# Patient Record
Sex: Male | Born: 1950 | Hispanic: No | Marital: Single
Health system: Southern US, Community
[De-identification: ages and names within clinical notes are randomized; demographics above are authoritative.]

---

## 2016-07-20 ENCOUNTER — Inpatient Hospital Stay
Admission: AD | Admit: 2016-07-20 | Discharge: 2016-08-10 | Disposition: A | Discharge status: Skilled Nursing Facility | Payer: Self-pay | Source: Ambulatory Visit | Attending: Physician Assistant | Admitting: Physician Assistant

## 2016-07-20 ENCOUNTER — Other Ambulatory Visit (HOSPITAL_COMMUNITY): Payer: Self-pay

## 2016-07-20 DIAGNOSIS — R4182 Altered mental status, unspecified: Secondary | ICD-10-CM

## 2016-07-20 DIAGNOSIS — Z931 Gastrostomy status: Secondary | ICD-10-CM

## 2016-07-20 DIAGNOSIS — R1310 Dysphagia, unspecified: Secondary | ICD-10-CM

## 2016-07-20 DIAGNOSIS — Z452 Encounter for adjustment and management of vascular access device: Secondary | ICD-10-CM

## 2016-07-20 DIAGNOSIS — R0989 Other specified symptoms and signs involving the circulatory and respiratory systems: Secondary | ICD-10-CM

## 2016-07-20 DIAGNOSIS — J189 Pneumonia, unspecified organism: Secondary | ICD-10-CM

## 2016-07-20 DIAGNOSIS — T17908A Unspecified foreign body in respiratory tract, part unspecified causing other injury, initial encounter: Secondary | ICD-10-CM

## 2016-07-20 DIAGNOSIS — J9690 Respiratory failure, unspecified, unspecified whether with hypoxia or hypercapnia: Secondary | ICD-10-CM

## 2016-07-20 DIAGNOSIS — D72829 Elevated white blood cell count, unspecified: Secondary | ICD-10-CM

## 2016-07-20 LAB — VANCOMYCIN, TROUGH: Vancomycin Tr: 29 ug/mL (ref 15–20)

## 2016-07-20 MED ORDER — DIATRIZOATE MEGLUMINE & SODIUM 66-10 % PO SOLN
ORAL | Status: AC
  Filled 2016-07-20: qty 30

## 2016-07-21 LAB — COMPREHENSIVE METABOLIC PANEL
ALT: 16 U/L — ABNORMAL LOW (ref 17–63)
AST: 25 U/L (ref 15–41)
Albumin: 2.3 g/dL — ABNORMAL LOW (ref 3.5–5.0)
Alkaline Phosphatase: 110 U/L (ref 38–126)
Anion gap: 12 (ref 5–15)
BUN: 8 mg/dL (ref 6–20)
CO2: 23 mmol/L (ref 22–32)
Calcium: 8.9 mg/dL (ref 8.9–10.3)
Chloride: 109 mmol/L (ref 101–111)
Creatinine, Ser: 1.05 mg/dL (ref 0.61–1.24)
GFR calc Af Amer: >60 mL/min (ref >60)
GFR calc non Af Amer: >60 mL/min (ref >60)
Glucose, Bld: 88 mg/dL (ref 65–99)
Potassium: 3.1 mmol/L — ABNORMAL LOW (ref 3.5–5.1)
Sodium: 144 mmol/L (ref 135–145)
Total Bilirubin: 0.8 mg/dL (ref 0.3–1.2)
Total Protein: 5.6 g/dL — ABNORMAL LOW (ref 6.5–8.1)

## 2016-07-21 LAB — VANCOMYCIN, TROUGH: Vancomycin Tr: 15 ug/mL (ref 15–20)

## 2016-07-21 LAB — CBC WITH DIFFERENTIAL/PLATELET
BASOS ABS: 0.1 10*3/uL (ref 0.0–0.1)
Basophils Relative: 1 %
EOS PCT: 2 %
Eosinophils Absolute: 0.3 10*3/uL (ref 0.0–0.7)
HEMATOCRIT: 28.6 % — AB (ref 39.0–52.0)
Hemoglobin: 9.4 g/dL — ABNORMAL LOW (ref 13.0–17.0)
LYMPHS PCT: 16 %
Lymphs Abs: 1.8 10*3/uL (ref 0.7–4.0)
MCH: 31.4 pg (ref 26.0–34.0)
MCHC: 32.9 g/dL (ref 30.0–36.0)
MCV: 95.7 fL (ref 78.0–100.0)
Monocytes Absolute: 1 10*3/uL (ref 0.1–1.0)
Monocytes Relative: 9 %
NEUTROS ABS: 7.8 10*3/uL — AB (ref 1.7–7.7)
Neutrophils Relative %: 72 %
PLATELETS: 395 10*3/uL (ref 150–400)
RBC: 2.99 MIL/uL — AB (ref 4.22–5.81)
RDW: 13.3 % (ref 11.5–15.5)
WBC: 10.9 10*3/uL — ABNORMAL HIGH (ref 4.0–10.5)

## 2016-07-22 LAB — CBC WITH DIFFERENTIAL/PLATELET
Basophils Absolute: 0.1 10*3/uL (ref 0.0–0.1)
Basophils Relative: 1 %
EOS ABS: 0.2 10*3/uL (ref 0.0–0.7)
EOS PCT: 2 %
HCT: 25.7 % — ABNORMAL LOW (ref 39.0–52.0)
HEMOGLOBIN: 8.4 g/dL — AB (ref 13.0–17.0)
LYMPHS ABS: 1.7 10*3/uL (ref 0.7–4.0)
Lymphocytes Relative: 14 %
MCH: 31.3 pg (ref 26.0–34.0)
MCHC: 32.7 g/dL (ref 30.0–36.0)
MCV: 95.9 fL (ref 78.0–100.0)
MONOS PCT: 10 %
Monocytes Absolute: 1.2 10*3/uL — ABNORMAL HIGH (ref 0.1–1.0)
NEUTROS PCT: 73 %
Neutro Abs: 8.9 10*3/uL — ABNORMAL HIGH (ref 1.7–7.7)
Platelets: 377 10*3/uL (ref 150–400)
RBC: 2.68 MIL/uL — ABNORMAL LOW (ref 4.22–5.81)
RDW: 13.4 % (ref 11.5–15.5)
WBC: 12 10*3/uL — ABNORMAL HIGH (ref 4.0–10.5)

## 2016-07-22 LAB — BASIC METABOLIC PANEL
ANION GAP: 7 (ref 5–15)
BUN: 5 mg/dL — ABNORMAL LOW (ref 6–20)
CALCIUM: 8.4 mg/dL — AB (ref 8.9–10.3)
CHLORIDE: 112 mmol/L — AB (ref 101–111)
CO2: 26 mmol/L (ref 22–32)
Creatinine, Ser: 1.12 mg/dL (ref 0.61–1.24)
GFR calc Af Amer: >60 mL/min (ref >60)
GFR calc non Af Amer: >60 mL/min (ref >60)
GLUCOSE: 87 mg/dL (ref 65–99)
Potassium: 2.8 mmol/L — ABNORMAL LOW (ref 3.5–5.1)
Sodium: 145 mmol/L (ref 135–145)

## 2016-07-22 LAB — AMMONIA: Ammonia: 27 umol/L (ref 9–35)

## 2016-07-22 LAB — PHOSPHORUS: PHOSPHORUS: 2.1 mg/dL — AB (ref 2.5–4.6)

## 2016-07-23 ENCOUNTER — Other Ambulatory Visit (HOSPITAL_COMMUNITY): Payer: Self-pay

## 2016-07-23 LAB — BASIC METABOLIC PANEL
ANION GAP: 9 (ref 5–15)
BUN: 5 mg/dL — ABNORMAL LOW (ref 6–20)
CALCIUM: 8.7 mg/dL — AB (ref 8.9–10.3)
CO2: 22 mmol/L (ref 22–32)
Chloride: 114 mmol/L — ABNORMAL HIGH (ref 101–111)
Creatinine, Ser: 1.08 mg/dL (ref 0.61–1.24)
GLUCOSE: 122 mg/dL — AB (ref 65–99)
POTASSIUM: 3.5 mmol/L (ref 3.5–5.1)
Sodium: 145 mmol/L (ref 135–145)

## 2016-07-23 LAB — PHOSPHORUS: PHOSPHORUS: 2.8 mg/dL (ref 2.5–4.6)

## 2016-07-23 LAB — MAGNESIUM: MAGNESIUM: 1.5 mg/dL — AB (ref 1.7–2.4)

## 2016-07-24 LAB — BASIC METABOLIC PANEL
Anion gap: 10 (ref 5–15)
BUN: 7 mg/dL (ref 6–20)
CHLORIDE: 114 mmol/L — AB (ref 101–111)
CO2: 23 mmol/L (ref 22–32)
CREATININE: 1.18 mg/dL (ref 0.61–1.24)
Calcium: 9 mg/dL (ref 8.9–10.3)
GFR calc Af Amer: >60 mL/min (ref >60)
GFR calc non Af Amer: >60 mL/min (ref >60)
Glucose, Bld: 119 mg/dL — ABNORMAL HIGH (ref 65–99)
POTASSIUM: 3.3 mmol/L — AB (ref 3.5–5.1)
SODIUM: 147 mmol/L — AB (ref 135–145)

## 2016-07-24 LAB — CBC WITH DIFFERENTIAL/PLATELET
Basophils Absolute: 0.1 10*3/uL (ref 0.0–0.1)
Basophils Relative: 0 %
EOS ABS: 0.2 10*3/uL (ref 0.0–0.7)
Eosinophils Relative: 1 %
HEMATOCRIT: 27.7 % — AB (ref 39.0–52.0)
HEMOGLOBIN: 8.9 g/dL — AB (ref 13.0–17.0)
LYMPHS ABS: 2 10*3/uL (ref 0.7–4.0)
LYMPHS PCT: 14 %
MCH: 31.4 pg (ref 26.0–34.0)
MCHC: 32.1 g/dL (ref 30.0–36.0)
MCV: 97.9 fL (ref 78.0–100.0)
MONOS PCT: 8 %
Monocytes Absolute: 1.2 10*3/uL — ABNORMAL HIGH (ref 0.1–1.0)
NEUTROS ABS: 11 10*3/uL — AB (ref 1.7–7.7)
NEUTROS PCT: 77 %
Platelets: 395 10*3/uL (ref 150–400)
RBC: 2.83 MIL/uL — AB (ref 4.22–5.81)
RDW: 14 % (ref 11.5–15.5)
WBC: 14.5 10*3/uL — AB (ref 4.0–10.5)

## 2016-07-24 LAB — PHOSPHORUS: PHOSPHORUS: 2.4 mg/dL — AB (ref 2.5–4.6)

## 2016-07-24 LAB — MAGNESIUM: MAGNESIUM: 1.6 mg/dL — AB (ref 1.7–2.4)

## 2016-07-25 ENCOUNTER — Other Ambulatory Visit (HOSPITAL_COMMUNITY): Payer: Self-pay

## 2016-07-25 LAB — CBC WITH DIFFERENTIAL/PLATELET
BASOS ABS: 0 10*3/uL (ref 0.0–0.1)
Basophils Relative: 0 %
EOS ABS: 0.4 10*3/uL (ref 0.0–0.7)
Eosinophils Relative: 3 %
HCT: 24.9 % — ABNORMAL LOW (ref 39.0–52.0)
Hemoglobin: 8.3 g/dL — ABNORMAL LOW (ref 13.0–17.0)
LYMPHS ABS: 1.7 10*3/uL (ref 0.7–4.0)
Lymphocytes Relative: 12 %
MCH: 32.7 pg (ref 26.0–34.0)
MCHC: 33.3 g/dL (ref 30.0–36.0)
MCV: 98 fL (ref 78.0–100.0)
MONO ABS: 1 10*3/uL (ref 0.1–1.0)
Monocytes Relative: 7 %
NEUTROS ABS: 10.7 10*3/uL — AB (ref 1.7–7.7)
Neutrophils Relative %: 78 %
PLATELETS: 439 10*3/uL — AB (ref 150–400)
RBC: 2.54 MIL/uL — ABNORMAL LOW (ref 4.22–5.81)
RDW: 14.4 % (ref 11.5–15.5)
WBC: 13.8 10*3/uL — AB (ref 4.0–10.5)

## 2016-07-25 LAB — BASIC METABOLIC PANEL
Anion gap: 8 (ref 5–15)
BUN: 8 mg/dL (ref 6–20)
CO2: 23 mmol/L (ref 22–32)
CREATININE: 1.1 mg/dL (ref 0.61–1.24)
Calcium: 8.7 mg/dL — ABNORMAL LOW (ref 8.9–10.3)
Chloride: 113 mmol/L — ABNORMAL HIGH (ref 101–111)
GFR calc Af Amer: >60 mL/min (ref >60)
GLUCOSE: 105 mg/dL — AB (ref 65–99)
POTASSIUM: 4.4 mmol/L (ref 3.5–5.1)
Sodium: 144 mmol/L (ref 135–145)

## 2016-07-25 LAB — PHOSPHORUS: PHOSPHORUS: 2 mg/dL — AB (ref 2.5–4.6)

## 2016-07-25 LAB — MAGNESIUM: MAGNESIUM: 1.9 mg/dL (ref 1.7–2.4)

## 2016-07-25 LAB — C DIFFICILE QUICK SCREEN W PCR REFLEX
C DIFFICILE (CDIFF) INTERP: NOT DETECTED
C Diff antigen: NEGATIVE
C Diff toxin: NEGATIVE

## 2016-07-25 LAB — APTT: aPTT: 47 seconds — ABNORMAL HIGH (ref 24–36)

## 2016-07-26 ENCOUNTER — Other Ambulatory Visit (HOSPITAL_COMMUNITY): Payer: Self-pay

## 2016-07-26 LAB — CBC WITH DIFFERENTIAL/PLATELET
BASOS ABS: 0 10*3/uL (ref 0.0–0.1)
BASOS PCT: 0 %
EOS ABS: 0.3 10*3/uL (ref 0.0–0.7)
EOS PCT: 2 %
HCT: 27.8 % — ABNORMAL LOW (ref 39.0–52.0)
HEMOGLOBIN: 8.6 g/dL — AB (ref 13.0–17.0)
Lymphocytes Relative: 10 %
Lymphs Abs: 1.7 10*3/uL (ref 0.7–4.0)
MCH: 31 pg (ref 26.0–34.0)
MCHC: 30.9 g/dL (ref 30.0–36.0)
MCV: 100.4 fL — ABNORMAL HIGH (ref 78.0–100.0)
Monocytes Absolute: 1 10*3/uL (ref 0.1–1.0)
Monocytes Relative: 6 %
NEUTROS PCT: 82 %
Neutro Abs: 14.8 10*3/uL — ABNORMAL HIGH (ref 1.7–7.7)
PLATELETS: 541 10*3/uL — AB (ref 150–400)
RBC: 2.77 MIL/uL — AB (ref 4.22–5.81)
RDW: 14.7 % (ref 11.5–15.5)
WBC: 17.9 10*3/uL — AB (ref 4.0–10.5)

## 2016-07-26 LAB — PHOSPHORUS: PHOSPHORUS: 2.6 mg/dL (ref 2.5–4.6)

## 2016-07-26 LAB — BASIC METABOLIC PANEL
Anion gap: 8 (ref 5–15)
BUN: 19 mg/dL (ref 6–20)
CHLORIDE: 112 mmol/L — AB (ref 101–111)
CO2: 27 mmol/L (ref 22–32)
CREATININE: 1.33 mg/dL — AB (ref 0.61–1.24)
Calcium: 9.3 mg/dL (ref 8.9–10.3)
GFR calc Af Amer: >60 mL/min (ref >60)
GFR calc non Af Amer: 54 mL/min — ABNORMAL LOW (ref >60)
Glucose, Bld: 110 mg/dL — ABNORMAL HIGH (ref 65–99)
POTASSIUM: 3.3 mmol/L — AB (ref 3.5–5.1)
SODIUM: 147 mmol/L — AB (ref 135–145)

## 2016-07-26 LAB — MAGNESIUM: MAGNESIUM: 2 mg/dL (ref 1.7–2.4)

## 2016-07-26 LAB — PROTIME-INR
INR: 1.54
PROTHROMBIN TIME: 18.7 s — AB (ref 11.4–15.2)

## 2016-07-27 ENCOUNTER — Other Ambulatory Visit (HOSPITAL_COMMUNITY): Payer: Self-pay

## 2016-07-27 LAB — CBC WITH DIFFERENTIAL/PLATELET
BASOS ABS: 0 10*3/uL (ref 0.0–0.1)
Basophils Relative: 0 %
EOS ABS: 0 10*3/uL (ref 0.0–0.7)
Eosinophils Relative: 0 %
HCT: 25.1 % — ABNORMAL LOW (ref 39.0–52.0)
HEMOGLOBIN: 7.8 g/dL — AB (ref 13.0–17.0)
LYMPHS PCT: 4 %
Lymphs Abs: 0.7 10*3/uL (ref 0.7–4.0)
MCH: 31.6 pg (ref 26.0–34.0)
MCHC: 31.1 g/dL (ref 30.0–36.0)
MCV: 101.6 fL — ABNORMAL HIGH (ref 78.0–100.0)
MONO ABS: 0.9 10*3/uL (ref 0.1–1.0)
Monocytes Relative: 5 %
NEUTROS PCT: 91 %
Neutro Abs: 16.4 10*3/uL — ABNORMAL HIGH (ref 1.7–7.7)
PLATELETS: 469 10*3/uL — AB (ref 150–400)
RBC: 2.47 MIL/uL — ABNORMAL LOW (ref 4.22–5.81)
RDW: 15.1 % (ref 11.5–15.5)
WBC: 18 10*3/uL — ABNORMAL HIGH (ref 4.0–10.5)

## 2016-07-27 LAB — BLOOD GAS, ARTERIAL
Acid-Base Excess: 0.8 mmol/L (ref 0.0–2.0)
Bicarbonate: 24.1 mEq/L — ABNORMAL HIGH (ref 20.0–24.0)
Drawn by: 290171
FIO2: 100
O2 Saturation: 99.8 %
Patient temperature: 100.3
TCO2: 25.2 mmol/L (ref 0–100)
pCO2 arterial: 35 mmHg (ref 35.0–45.0)
pH, Arterial: 7.458 — ABNORMAL HIGH (ref 7.350–7.450)
pO2, Arterial: 232 mmHg — ABNORMAL HIGH (ref 80.0–100.0)

## 2016-07-27 LAB — RENAL FUNCTION PANEL
ALBUMIN: 1.9 g/dL — AB (ref 3.5–5.0)
ANION GAP: 10 (ref 5–15)
BUN: 27 mg/dL — ABNORMAL HIGH (ref 6–20)
CALCIUM: 9.5 mg/dL (ref 8.9–10.3)
CO2: 26 mmol/L (ref 22–32)
CREATININE: 1.62 mg/dL — AB (ref 0.61–1.24)
Chloride: 115 mmol/L — ABNORMAL HIGH (ref 101–111)
GFR, EST AFRICAN AMERICAN: 49 mL/min — AB (ref >60)
GFR, EST NON AFRICAN AMERICAN: 42 mL/min — AB (ref >60)
Glucose, Bld: 114 mg/dL — ABNORMAL HIGH (ref 65–99)
PHOSPHORUS: 1.7 mg/dL — AB (ref 2.5–4.6)
Potassium: 4 mmol/L (ref 3.5–5.1)
SODIUM: 151 mmol/L — AB (ref 135–145)

## 2016-07-27 LAB — MAGNESIUM: Magnesium: 2.1 mg/dL (ref 1.7–2.4)

## 2016-07-27 NOTE — Consult Note (Signed)
Chief Complaint: Patient was seen in consultation today for percutaneous gastric tube placement at the request of Dr Carron Curie  Referring Physician(s): Dr Sharyon Medicus  Supervising Physician: Jolaine Click  Patient Status: Inpatient  History of Present Illness: Bradley Vazquez is a 65 y.o. male   Hx Afib; CAD; HLD; COPD with resp failure Events: noted elevated LFTs; high total bilirubin ERCP/MRCP 07/09/16----new microperforation Exploratory lap 7./28 Developed resp failure and placed on vent Underwent bronchoscopy for mucus plug evacuation 7/29 Encephalopathy and delirium Dysphagia Malnutrition Non communicative Need for long term care Request for percutaneous gastric tube placement Imaging has been reviewed and Dr Loreta Ave approves procedure  On Eliquis---need to hold 2 days Temp 100.3 last pm---need afeb Wbc 18 today--- need wnl (not on steroids) New dx possible shingles  No past medical history on file.  No past surgical history on file.  Allergies: Review of patient's allergies indicates not on file.  Medications: Prior to Admission medications   Not on File     No family history on file.  Social History   Social History  . Marital status: Single    Spouse name: N/A  . Number of children: N/A  . Years of education: N/A   Social History Main Topics  . Smoking status: Not on file  . Smokeless tobacco: Not on file  . Alcohol use Not on file  . Drug use: Unknown  . Sexual activity: Not on file   Other Topics Concern  . Not on file   Social History Narrative  . No narrative on file     Review of Systems: A 12 point ROS discussed and pertinent positives are indicated in the HPI above.  All other systems are negative.  Review of Systems  Constitutional: Negative for fever.  Respiratory: Positive for shortness of breath.   Psychiatric/Behavioral: Positive for agitation.    Vital Signs: There were no vitals taken for this visit.  Physical Exam    Cardiovascular: Normal rate and regular rhythm.   Pulmonary/Chest: He has wheezes.  Abdominal: Soft. Bowel sounds are normal. There is no tenderness.  with mostly healed scar from abd surgery   Musculoskeletal: Normal range of motion.  R BKA Moves all 4s spontaneously Does not follow commands noncommunicating   Skin: Skin is warm and dry.     Psychiatric:  unable to fully assess Non verbal  Nursing note reviewed.   Mallampati Score:  MD Evaluation Airway: WNL Heart: WNL Abdomen: WNL Chest/ Lungs: WNL ASA  Classification: 3 Mallampati/Airway Score: Two  Imaging: Ct Abdomen Wo Contrast  Result Date: 07/25/2016 CLINICAL DATA:  65 year old male with a history of dysphagia. EXAM: CT ABDOMEN WITHOUT CONTRAST TECHNIQUE: Multidetector CT imaging of the abdomen was performed following the standard protocol without IV contrast. COMPARISON:  None. FINDINGS: Lower chest: Unremarkable appearance of the soft tissues of the chest wall. Heart size within normal limits.  No pericardial fluid/thickening. Calcifications in the distribution of the right coronary artery. No lower mediastinal adenopathy. Large hiatal hernia Surgical changes of median sternotomy. Bilateral pleural effusions with associated atelectasis. Respiratory motion somewhat limits evaluation of the lung bases. Abdomen: Surgical changes in the midline abdomen with surgical staples within the soft tissues. Trace fluid along the surgical site of the anterior abdominal wall musculature measuring 19 mm, potentially postoperative for small seroma/hematoma. Unremarkable appearance of liver parenchyma. Small amount of gas within the biliary tree of the left liver lobe. There is a stent within the common bile duct. Surgical  changes of cholecystectomy. Calcifications of the spleen, compatible with prior granulomatous disease. Unremarkable bilateral adrenal glands. Unremarkable appearance of pancreas. No peripancreatic inflammatory changes. Gas  overlying the right liver lobe is presumably within bowel though evaluation limited by motion. Gastric tube terminates within the lumen of the stomach. Unremarkable appearance of visualized small bowel and colon. Bilateral moderate hydronephrosis with ureteral stents in place within the left and right collecting system. Gas within the right collecting system, potentially related to recent instrumentation. Calcifications of the abdominal aorta and bilateral iliac system. There is ill-defined soft tissue posterior to the pancreatic head, adjacent to the duodenal loop (image 31). Ill-defined fat planes in this region. IMPRESSION: No acute finding on the abdominal CT. Ill-defined soft tissue posterior to the pancreatic head, with ill-defined fat planes. It is uncertain if this represents artifact secondary to the significant respiratory motion, or if this is abnormal soft tissue, which would be of indeterminate etiology. This CT does not rule out pathologic adenopathy, pancreatitis, or potentially a soft tissue lesion. Further evaluation with contrast-enhanced CT may be considered, and may require sedation to resolve patient motion issue. Bilateral ureteral stents in the collecting system of the kidneys. Mild a moderate bilateral hydronephrosis. Gas within the right collecting system, potentially related to instrumentation. Recommend correlation with a history of recent surgery. Common bile duct stent in place, which likely accounts for the gas within the biliary tree. If there is concern for infection, recommend correlation with lab values. Cholecystectomy. Large hiatal hernia. Aortic atherosclerosis.  Associated coronary artery disease. Bilateral pleural effusions. Signed, Yvone NeuJaime S. Loreta AveWagner, DO [facility] Electronically Signed   By: Gilmer MorJaime  Wagner D.O.   On: 07/25/2016 16:00   Dg Chest Port 1 View  Result Date: 07/27/2016 CLINICAL DATA:  Acute onset of  pulmonary congestion. Initial encounter. EXAM: PORTABLE CHEST 1 VIEW COMPARISON:  Chest radiograph performed 07/26/2016 FINDINGS: The patient's enteric tube is seen extending below the diaphragm. Vascular congestion is noted. Increased central lung markings raise concern for pulmonary edema. No pleural effusion or pneumothorax is seen. The cardiomediastinal silhouette is borderline enlarged. The patient is status post median sternotomy. No acute osseous abnormalities are identified. IMPRESSION: Vascular congestion and borderline cardiomegaly. Increased central lung markings raise concern for pulmonary edema. Electronically Signed   By: Roanna RaiderJeffery  Chang M.D.   On: 07/27/2016 03:56   Dg Chest Port 1 View  Result Date: 07/26/2016 CLINICAL DATA:  Aspiration and airway. EXAM: PORTABLE CHEST 1 VIEW COMPARISON:  Three days ago FINDINGS: Asymmetric right-sided streaky opacity and volume loss. Nasogastric tube is in stable position. Chronic cardiomegaly. Status post median sternotomy. Hyperinflation with flat diaphragm. No pneumothorax. IMPRESSION: 1. Cardiomegaly and stable vascular congestion. 2. History of aspiration which may account for asymmetric right-sided opacity. Electronically Signed   By: Marnee SpringJonathon  Watts M.D.   On: 07/26/2016 07:34   Dg Chest Port 1 View  Result Date: 07/23/2016 CLINICAL DATA:  Recent aspiration and tachycardia, initial encounter EXAM: PORTABLE CHEST 1 VIEW COMPARISON:  None. FINDINGS: Cardiac shadow is enlarged. A nasogastric catheter extends into the stomach. Mild vascular congestion is noted without interstitial edema. No focal infiltrate is seen. IMPRESSION: Mild vascular congestion. Electronically Signed   By: Alcide CleverMark  Lukens M.D.   On: 07/23/2016 11:43   Dg Abd Portable 1v  Result Date: 07/20/2016 CLINICAL DATA:  Patient status post NG tube insertion. EXAM: PORTABLE ABDOMEN - 1 VIEW COMPARISON:  None. FINDINGS: Enteric tube tip and side-port project over the stomach. The  proximal  aspect of bilateral nephroureteral stents are demonstrated. Right upper quadrant surgical clips. Probable biliary stent. Midline surgical staple line. Lumbar spine degenerative changes. Gas demonstrated within prominent loops of small bowel and colon. IMPRESSION: Enteric tube tip and side-port project over the stomach. Electronically Signed   By: Annia Belt M.D.   On: 07/20/2016 18:43    Labs:  CBC:  Recent Labs  07/24/16 0354 07/25/16 0620 07/26/16 0508 07/27/16 0543  WBC 14.5* 13.8* 17.9* 18.0*  HGB 8.9* 8.3* 8.6* 7.8*  HCT 27.7* 24.9* 27.8* 25.1*  PLT 395 439* 541* 469*    COAGS:  Recent Labs  07/25/16 1101 07/26/16 0508  INR  --  1.54  APTT 47*  --     BMP:  Recent Labs  07/24/16 0354 07/25/16 0620 07/26/16 0508 07/27/16 0543  NA 147* 144 147* 151*  K 3.3* 4.4 3.3* 4.0  CL 114* 113* 112* 115*  CO2 GLUCOSE 119* 105* 110* 114*  BUN 27*  CALCIUM 9.0 8.7* 9.3 9.5  CREATININE 1.18 1.10 1.33* 1.62*  GFRNONAA >60 >60 54* 42*  GFRAA >60 >60 >60 49*    LIVER FUNCTION TESTS:  Recent Labs  07/21/16 0645 07/27/16 0543  BILITOT 0.8  --   AST 25  --   ALT 16*  --   ALKPHOS 110  --   PROT 5.6*  --   ALBUMIN 2.3* 1.9*    TUMOR MARKERS: No results for input(s): AFPTM, CEA, CA199, CHROMGRNA in the last 8760 hours.  Assessment and Plan:  Encephalopathy Dysphagia Need for long term care Malnutrition Scheduled for percutaneous gastric tube placement in IR ** when appropriate** Need wbc wnl; afeb; off Eliquis 2 days I have been unable to reach family for consent  IR PA will continue to check pt and chart  Thank you for this interesting consult.  I greatly enjoyed meeting ARUSH GATLIFF and look forward to participating in their care.  A copy of this report was sent to the requesting provider on this date.  Electronically Signed: Calum Cormier A 07/27/2016, 1:39 PM   I spent a total of 40 Minutes    in face to face in clinical  consultation, greater than 50% of which was counseling/coordinating care for percutaneous gastric tube placement

## 2016-07-28 LAB — URINALYSIS, ROUTINE W REFLEX MICROSCOPIC
Bilirubin Urine: NEGATIVE
GLUCOSE, UA: NEGATIVE mg/dL
Ketones, ur: NEGATIVE mg/dL
Nitrite: NEGATIVE
Protein, ur: 100 mg/dL — AB
SPECIFIC GRAVITY, URINE: 1.014 (ref 1.005–1.030)
pH: 6.5 (ref 5.0–8.0)

## 2016-07-28 LAB — URINE MICROSCOPIC-ADD ON

## 2016-07-28 LAB — RENAL FUNCTION PANEL
ALBUMIN: 1.8 g/dL — AB (ref 3.5–5.0)
Anion gap: 9 (ref 5–15)
BUN: 36 mg/dL — ABNORMAL HIGH (ref 6–20)
CALCIUM: 9 mg/dL (ref 8.9–10.3)
CO2: 24 mmol/L (ref 22–32)
CREATININE: 2 mg/dL — AB (ref 0.61–1.24)
Chloride: 114 mmol/L — ABNORMAL HIGH (ref 101–111)
GFR calc non Af Amer: 33 mL/min — ABNORMAL LOW (ref >60)
GFR, EST AFRICAN AMERICAN: 38 mL/min — AB (ref >60)
GLUCOSE: 132 mg/dL — AB (ref 65–99)
PHOSPHORUS: 2.6 mg/dL (ref 2.5–4.6)
Potassium: 3.5 mmol/L (ref 3.5–5.1)
SODIUM: 147 mmol/L — AB (ref 135–145)

## 2016-07-28 LAB — CBC WITH DIFFERENTIAL/PLATELET
BASOS PCT: 0 %
Basophils Absolute: 0 10*3/uL (ref 0.0–0.1)
EOS ABS: 0.4 10*3/uL (ref 0.0–0.7)
EOS PCT: 3 %
HCT: 22.5 % — ABNORMAL LOW (ref 39.0–52.0)
Hemoglobin: 7 g/dL — ABNORMAL LOW (ref 13.0–17.0)
LYMPHS ABS: 1.7 10*3/uL (ref 0.7–4.0)
Lymphocytes Relative: 10 %
MCH: 31.5 pg (ref 26.0–34.0)
MCHC: 31.1 g/dL (ref 30.0–36.0)
MCV: 101.4 fL — ABNORMAL HIGH (ref 78.0–100.0)
Monocytes Absolute: 0.9 10*3/uL (ref 0.1–1.0)
Monocytes Relative: 6 %
NEUTROS PCT: 81 %
Neutro Abs: 13 10*3/uL — ABNORMAL HIGH (ref 1.7–7.7)
PLATELETS: 433 10*3/uL — AB (ref 150–400)
RBC: 2.22 MIL/uL — AB (ref 4.22–5.81)
RDW: 15.2 % (ref 11.5–15.5)
WBC: 16 10*3/uL — AB (ref 4.0–10.5)

## 2016-07-28 LAB — ABO/RH: ABO/RH(D): O NEG

## 2016-07-28 LAB — PREPARE RBC (CROSSMATCH)

## 2016-07-28 LAB — MAGNESIUM: Magnesium: 2.3 mg/dL (ref 1.7–2.4)

## 2016-07-28 LAB — OCCULT BLOOD X 1 CARD TO LAB, STOOL: Fecal Occult Bld: POSITIVE — AB

## 2016-07-29 LAB — CBC WITH DIFFERENTIAL/PLATELET
BASOS PCT: 0 %
Basophils Absolute: 0 10*3/uL (ref 0.0–0.1)
Eosinophils Absolute: 0.4 10*3/uL (ref 0.0–0.7)
Eosinophils Relative: 2 %
HEMATOCRIT: 23.8 % — AB (ref 39.0–52.0)
Hemoglobin: 7.4 g/dL — ABNORMAL LOW (ref 13.0–17.0)
LYMPHS ABS: 1.3 10*3/uL (ref 0.7–4.0)
Lymphocytes Relative: 6 %
MCH: 30.2 pg (ref 26.0–34.0)
MCHC: 31.1 g/dL (ref 30.0–36.0)
MCV: 97.1 fL (ref 78.0–100.0)
MONO ABS: 1.5 10*3/uL — AB (ref 0.1–1.0)
Monocytes Relative: 7 %
NEUTROS ABS: 18.1 10*3/uL — AB (ref 1.7–7.7)
NEUTROS PCT: 85 %
Platelets: 439 10*3/uL — ABNORMAL HIGH (ref 150–400)
RBC: 2.45 MIL/uL — ABNORMAL LOW (ref 4.22–5.81)
RDW: 18.1 % — AB (ref 11.5–15.5)
WBC: 21.3 10*3/uL — ABNORMAL HIGH (ref 4.0–10.5)

## 2016-07-29 LAB — RENAL FUNCTION PANEL
ANION GAP: 7 (ref 5–15)
Albumin: 1.7 g/dL — ABNORMAL LOW (ref 3.5–5.0)
BUN: 32 mg/dL — AB (ref 6–20)
CHLORIDE: 115 mmol/L — AB (ref 101–111)
CO2: 22 mmol/L (ref 22–32)
Calcium: 8.6 mg/dL — ABNORMAL LOW (ref 8.9–10.3)
Creatinine, Ser: 1.94 mg/dL — ABNORMAL HIGH (ref 0.61–1.24)
GFR calc Af Amer: 39 mL/min — ABNORMAL LOW (ref >60)
GFR calc non Af Amer: 34 mL/min — ABNORMAL LOW (ref >60)
GLUCOSE: 140 mg/dL — AB (ref 65–99)
POTASSIUM: 3.7 mmol/L (ref 3.5–5.1)
Phosphorus: 3.9 mg/dL (ref 2.5–4.6)
Sodium: 144 mmol/L (ref 135–145)

## 2016-07-29 LAB — CULTURE, RESPIRATORY W GRAM STAIN

## 2016-07-29 LAB — TYPE AND SCREEN
ABO/RH(D): O NEG
Antibody Screen: NEGATIVE
Unit division: 0

## 2016-07-29 LAB — URINE CULTURE: Culture: NO GROWTH

## 2016-07-29 LAB — C DIFFICILE QUICK SCREEN W PCR REFLEX
C DIFFICLE (CDIFF) ANTIGEN: NEGATIVE
C Diff interpretation: NOT DETECTED
C Diff toxin: NEGATIVE

## 2016-07-29 LAB — MAGNESIUM: Magnesium: 2.1 mg/dL (ref 1.7–2.4)

## 2016-07-30 ENCOUNTER — Encounter: Payer: Self-pay | Admitting: Radiology

## 2016-07-30 ENCOUNTER — Other Ambulatory Visit (HOSPITAL_COMMUNITY): Payer: Self-pay

## 2016-07-30 LAB — COMPREHENSIVE METABOLIC PANEL
ALBUMIN: 1.8 g/dL — AB (ref 3.5–5.0)
ALT: 22 U/L (ref 17–63)
ANION GAP: 8 (ref 5–15)
AST: 23 U/L (ref 15–41)
Alkaline Phosphatase: 96 U/L (ref 38–126)
BILIRUBIN TOTAL: 0.6 mg/dL (ref 0.3–1.2)
BUN: 22 mg/dL — AB (ref 6–20)
CALCIUM: 8.6 mg/dL — AB (ref 8.9–10.3)
CO2: 21 mmol/L — ABNORMAL LOW (ref 22–32)
Chloride: 116 mmol/L — ABNORMAL HIGH (ref 101–111)
Creatinine, Ser: 1.58 mg/dL — ABNORMAL HIGH (ref 0.61–1.24)
GFR calc Af Amer: 51 mL/min — ABNORMAL LOW (ref >60)
GFR, EST NON AFRICAN AMERICAN: 44 mL/min — AB (ref >60)
GLUCOSE: 115 mg/dL — AB (ref 65–99)
Potassium: 3.2 mmol/L — ABNORMAL LOW (ref 3.5–5.1)
Sodium: 145 mmol/L (ref 135–145)
TOTAL PROTEIN: 5.3 g/dL — AB (ref 6.5–8.1)

## 2016-07-30 LAB — CBC
HEMATOCRIT: 23.5 % — AB (ref 39.0–52.0)
HEMOGLOBIN: 7.5 g/dL — AB (ref 13.0–17.0)
MCH: 30.2 pg (ref 26.0–34.0)
MCHC: 31.9 g/dL (ref 30.0–36.0)
MCV: 94.8 fL (ref 78.0–100.0)
Platelets: 452 10*3/uL — ABNORMAL HIGH (ref 150–400)
RBC: 2.48 MIL/uL — ABNORMAL LOW (ref 4.22–5.81)
RDW: 17.5 % — ABNORMAL HIGH (ref 11.5–15.5)
WBC: 15.8 10*3/uL — ABNORMAL HIGH (ref 4.0–10.5)

## 2016-07-30 LAB — SODIUM, URINE, RANDOM: SODIUM UR: 74 mmol/L

## 2016-07-30 LAB — VITAMIN B12: Vitamin B-12: 844 pg/mL (ref 180–914)

## 2016-07-30 LAB — CK: Total CK: 21 U/L — ABNORMAL LOW (ref 49–397)

## 2016-07-30 LAB — AMMONIA: AMMONIA: 21 umol/L (ref 9–35)

## 2016-07-30 LAB — BRAIN NATRIURETIC PEPTIDE: B NATRIURETIC PEPTIDE 5: 707.4 pg/mL — AB (ref 0.0–100.0)

## 2016-07-31 LAB — CBC
HEMATOCRIT: 24.9 % — AB (ref 39.0–52.0)
HEMOGLOBIN: 7.7 g/dL — AB (ref 13.0–17.0)
MCH: 30.1 pg (ref 26.0–34.0)
MCHC: 30.9 g/dL (ref 30.0–36.0)
MCV: 97.3 fL (ref 78.0–100.0)
Platelets: 490 10*3/uL — ABNORMAL HIGH (ref 150–400)
RBC: 2.56 MIL/uL — AB (ref 4.22–5.81)
RDW: 17.3 % — AB (ref 11.5–15.5)
WBC: 15.8 10*3/uL — ABNORMAL HIGH (ref 4.0–10.5)

## 2016-07-31 LAB — VANCOMYCIN, TROUGH: VANCOMYCIN TR: 35 ug/mL — AB (ref 15–20)

## 2016-07-31 LAB — BASIC METABOLIC PANEL
Anion gap: 7 (ref 5–15)
BUN: 21 mg/dL — AB (ref 6–20)
CHLORIDE: 121 mmol/L — AB (ref 101–111)
CO2: 21 mmol/L — ABNORMAL LOW (ref 22–32)
Calcium: 8.9 mg/dL (ref 8.9–10.3)
Creatinine, Ser: 1.47 mg/dL — ABNORMAL HIGH (ref 0.61–1.24)
GFR calc Af Amer: 55 mL/min — ABNORMAL LOW (ref >60)
GFR calc non Af Amer: 48 mL/min — ABNORMAL LOW (ref >60)
Glucose, Bld: 129 mg/dL — ABNORMAL HIGH (ref 65–99)
POTASSIUM: 3.6 mmol/L (ref 3.5–5.1)
SODIUM: 149 mmol/L — AB (ref 135–145)

## 2016-08-01 LAB — CBC
HCT: 24.4 % — ABNORMAL LOW (ref 39.0–52.0)
HEMATOCRIT: 24.2 % — AB (ref 39.0–52.0)
HEMOGLOBIN: 7.5 g/dL — AB (ref 13.0–17.0)
Hemoglobin: 7.6 g/dL — ABNORMAL LOW (ref 13.0–17.0)
MCH: 30.2 pg (ref 26.0–34.0)
MCH: 29.9 pg (ref 26.0–34.0)
MCHC: 31.1 g/dL (ref 30.0–36.0)
MCHC: 31 g/dL (ref 30.0–36.0)
MCV: 96.8 fL (ref 78.0–100.0)
MCV: 96.4 fL (ref 78.0–100.0)
PLATELETS: 534 10*3/uL — AB (ref 150–400)
PLATELETS: 537 10*3/uL — AB (ref 150–400)
RBC: 2.52 MIL/uL — ABNORMAL LOW (ref 4.22–5.81)
RBC: 2.51 MIL/uL — AB (ref 4.22–5.81)
RDW: 16.9 % — AB (ref 11.5–15.5)
RDW: 16.8 % — ABNORMAL HIGH (ref 11.5–15.5)
WBC: 16.5 10*3/uL — AB (ref 4.0–10.5)
WBC: 16.7 10*3/uL — AB (ref 4.0–10.5)

## 2016-08-01 LAB — BASIC METABOLIC PANEL
ANION GAP: 6 (ref 5–15)
BUN: 16 mg/dL (ref 6–20)
CO2: 21 mmol/L — ABNORMAL LOW (ref 22–32)
Calcium: 8.9 mg/dL (ref 8.9–10.3)
Chloride: 118 mmol/L — ABNORMAL HIGH (ref 101–111)
Creatinine, Ser: 1.28 mg/dL — ABNORMAL HIGH (ref 0.61–1.24)
GFR calc Af Amer: >60 mL/min (ref >60)
GFR calc non Af Amer: 56 mL/min — ABNORMAL LOW (ref >60)
GLUCOSE: 97 mg/dL (ref 65–99)
Potassium: 3.1 mmol/L — ABNORMAL LOW (ref 3.5–5.1)
Sodium: 145 mmol/L (ref 135–145)

## 2016-08-01 LAB — CULTURE, BLOOD (ROUTINE X 2)
CULTURE: NO GROWTH
Culture: NO GROWTH

## 2016-08-01 LAB — VANCOMYCIN, TROUGH: Vancomycin Tr: 19 ug/mL (ref 15–20)

## 2016-08-01 LAB — PROTIME-INR
INR: 1.32
Prothrombin Time: 16.5 seconds — ABNORMAL HIGH (ref 11.4–15.2)

## 2016-08-02 ENCOUNTER — Other Ambulatory Visit (HOSPITAL_COMMUNITY): Payer: Self-pay

## 2016-08-02 ENCOUNTER — Encounter (HOSPITAL_COMMUNITY): Payer: Self-pay | Admitting: Interventional Radiology

## 2016-08-02 HISTORY — PX: IR GENERIC HISTORICAL: IMG1180011

## 2016-08-02 LAB — URINALYSIS, ROUTINE W REFLEX MICROSCOPIC
Bilirubin Urine: NEGATIVE
GLUCOSE, UA: NEGATIVE mg/dL
Ketones, ur: NEGATIVE mg/dL
Nitrite: NEGATIVE
PROTEIN: 100 mg/dL — AB
Specific Gravity, Urine: 1.019 (ref 1.005–1.030)
pH: 6.5 (ref 5.0–8.0)

## 2016-08-02 LAB — CBC
HCT: 22.4 % — ABNORMAL LOW (ref 39.0–52.0)
Hemoglobin: 7 g/dL — ABNORMAL LOW (ref 13.0–17.0)
MCH: 30.4 pg (ref 26.0–34.0)
MCHC: 31.3 g/dL (ref 30.0–36.0)
MCV: 97.4 fL (ref 78.0–100.0)
PLATELETS: 470 10*3/uL — AB (ref 150–400)
RBC: 2.3 MIL/uL — AB (ref 4.22–5.81)
RDW: 16.8 % — ABNORMAL HIGH (ref 11.5–15.5)
WBC: 14.1 10*3/uL — ABNORMAL HIGH (ref 4.0–10.5)

## 2016-08-02 LAB — BASIC METABOLIC PANEL
ANION GAP: 3 — AB (ref 5–15)
BUN: 19 mg/dL (ref 6–20)
CO2: 24 mmol/L (ref 22–32)
Calcium: 8.6 mg/dL — ABNORMAL LOW (ref 8.9–10.3)
Chloride: 118 mmol/L — ABNORMAL HIGH (ref 101–111)
Creatinine, Ser: 1.15 mg/dL (ref 0.61–1.24)
GFR calc Af Amer: >60 mL/min (ref >60)
Glucose, Bld: 156 mg/dL — ABNORMAL HIGH (ref 65–99)
POTASSIUM: 3.8 mmol/L (ref 3.5–5.1)
SODIUM: 145 mmol/L (ref 135–145)

## 2016-08-02 LAB — TYPE AND SCREEN
ABO/RH(D): O NEG
Antibody Screen: NEGATIVE

## 2016-08-02 LAB — AMMONIA: AMMONIA: 12 umol/L (ref 9–35)

## 2016-08-02 LAB — URINE MICROSCOPIC-ADD ON

## 2016-08-02 MED ORDER — LIDOCAINE HCL 1 % IJ SOLN
INTRAMUSCULAR | Status: AC
  Filled 2016-08-02: qty 20

## 2016-08-02 MED ORDER — IOPAMIDOL (ISOVUE-300) INJECTION 61%
INTRAVENOUS | Status: AC
  Administered 2016-08-02: 10 mL
  Filled 2016-08-02: qty 50

## 2016-08-02 MED ORDER — FENTANYL CITRATE (PF) 100 MCG/2ML IJ SOLN
INTRAMUSCULAR | Status: AC | PRN
  Administered 2016-08-02 (×2): 50 ug via INTRAVENOUS

## 2016-08-02 MED ORDER — LIDOCAINE HCL 1 % IJ SOLN
INTRAMUSCULAR | Status: DC | PRN
  Administered 2016-08-02: 10 mL

## 2016-08-02 MED ORDER — MIDAZOLAM HCL 2 MG/2ML IJ SOLN
INTRAMUSCULAR | Status: AC | PRN
  Administered 2016-08-02 (×3): 1 mg via INTRAVENOUS

## 2016-08-02 NOTE — Sedation Documentation (Signed)
Patient is resting comfortably. 

## 2016-08-02 NOTE — Procedures (Signed)
Interventional Radiology Procedure Note  Procedure: Placement of percutaneous 20F pull-through gastrostomy tube. Complications: None Recommendations: - NPO except for sips and chips remainder of today and overnight - Maintain G-tube to LWS until tomorrow morning  - May advance diet as tolerated and begin using tube tomorrow morning  Signed,   Emmerich Cryer S. Deryk Bozman, DO   

## 2016-08-03 LAB — CULTURE, BLOOD (ROUTINE X 2)
CULTURE: NO GROWTH
Culture: NO GROWTH

## 2016-08-03 LAB — AMMONIA: AMMONIA: 12 umol/L (ref 9–35)

## 2016-08-03 LAB — COMPREHENSIVE METABOLIC PANEL
ALK PHOS: 93 U/L (ref 38–126)
ALT: 21 U/L (ref 17–63)
AST: 27 U/L (ref 15–41)
Albumin: 1.8 g/dL — ABNORMAL LOW (ref 3.5–5.0)
Anion gap: 7 (ref 5–15)
BILIRUBIN TOTAL: 0.4 mg/dL (ref 0.3–1.2)
BUN: 19 mg/dL (ref 6–20)
CHLORIDE: 117 mmol/L — AB (ref 101–111)
CO2: 23 mmol/L (ref 22–32)
CREATININE: 1.09 mg/dL (ref 0.61–1.24)
Calcium: 8.9 mg/dL (ref 8.9–10.3)
Glucose, Bld: 110 mg/dL — ABNORMAL HIGH (ref 65–99)
POTASSIUM: 3.3 mmol/L — AB (ref 3.5–5.1)
Sodium: 147 mmol/L — ABNORMAL HIGH (ref 135–145)
TOTAL PROTEIN: 5.7 g/dL — AB (ref 6.5–8.1)

## 2016-08-03 LAB — CBC
HEMATOCRIT: 25 % — AB (ref 39.0–52.0)
Hemoglobin: 7.5 g/dL — ABNORMAL LOW (ref 13.0–17.0)
MCH: 29.5 pg (ref 26.0–34.0)
MCHC: 30 g/dL (ref 30.0–36.0)
MCV: 98.4 fL (ref 78.0–100.0)
Platelets: 519 10*3/uL — ABNORMAL HIGH (ref 150–400)
RBC: 2.54 MIL/uL — AB (ref 4.22–5.81)
RDW: 16.4 % — AB (ref 11.5–15.5)
WBC: 12.3 10*3/uL — AB (ref 4.0–10.5)

## 2016-08-03 LAB — VANCOMYCIN, TROUGH: VANCOMYCIN TR: 28 ug/mL — AB (ref 15–20)

## 2016-08-04 LAB — CBC
HCT: 24.5 % — ABNORMAL LOW (ref 39.0–52.0)
HEMOGLOBIN: 7.4 g/dL — AB (ref 13.0–17.0)
MCH: 29.5 pg (ref 26.0–34.0)
MCHC: 30.2 g/dL (ref 30.0–36.0)
MCV: 97.6 fL (ref 78.0–100.0)
Platelets: 465 10*3/uL — ABNORMAL HIGH (ref 150–400)
RBC: 2.51 MIL/uL — ABNORMAL LOW (ref 4.22–5.81)
RDW: 16.5 % — ABNORMAL HIGH (ref 11.5–15.5)
WBC: 11.8 10*3/uL — AB (ref 4.0–10.5)

## 2016-08-04 LAB — URINE CULTURE

## 2016-08-04 LAB — BASIC METABOLIC PANEL
ANION GAP: 5 (ref 5–15)
BUN: 15 mg/dL (ref 6–20)
CALCIUM: 8.6 mg/dL — AB (ref 8.9–10.3)
CO2: 21 mmol/L — ABNORMAL LOW (ref 22–32)
Chloride: 118 mmol/L — ABNORMAL HIGH (ref 101–111)
Creatinine, Ser: 0.98 mg/dL (ref 0.61–1.24)
GLUCOSE: 103 mg/dL — AB (ref 65–99)
Potassium: 4 mmol/L (ref 3.5–5.1)
SODIUM: 144 mmol/L (ref 135–145)

## 2016-08-05 ENCOUNTER — Other Ambulatory Visit (HOSPITAL_COMMUNITY): Payer: Self-pay

## 2016-08-05 LAB — COMPREHENSIVE METABOLIC PANEL
ALBUMIN: 1.8 g/dL — AB (ref 3.5–5.0)
ALT: 30 U/L (ref 17–63)
ANION GAP: 9 (ref 5–15)
AST: 45 U/L — ABNORMAL HIGH (ref 15–41)
Alkaline Phosphatase: 96 U/L (ref 38–126)
BILIRUBIN TOTAL: 0.4 mg/dL (ref 0.3–1.2)
BUN: 16 mg/dL (ref 6–20)
CO2: 24 mmol/L (ref 22–32)
Calcium: 8.9 mg/dL (ref 8.9–10.3)
Chloride: 111 mmol/L (ref 101–111)
Creatinine, Ser: 0.9 mg/dL (ref 0.61–1.24)
GFR calc Af Amer: >60 mL/min (ref >60)
GFR calc non Af Amer: >60 mL/min (ref >60)
GLUCOSE: 101 mg/dL — AB (ref 65–99)
POTASSIUM: 3.4 mmol/L — AB (ref 3.5–5.1)
SODIUM: 144 mmol/L (ref 135–145)
TOTAL PROTEIN: 5.7 g/dL — AB (ref 6.5–8.1)

## 2016-08-05 LAB — CBC WITH DIFFERENTIAL/PLATELET
BASOS PCT: 1 %
Basophils Absolute: 0.1 10*3/uL (ref 0.0–0.1)
EOS ABS: 0.5 10*3/uL (ref 0.0–0.7)
EOS PCT: 4 %
HEMATOCRIT: 26.6 % — AB (ref 39.0–52.0)
Hemoglobin: 7.9 g/dL — ABNORMAL LOW (ref 13.0–17.0)
Lymphocytes Relative: 18 %
Lymphs Abs: 2.1 10*3/uL (ref 0.7–4.0)
MCH: 28.8 pg (ref 26.0–34.0)
MCHC: 29.7 g/dL — AB (ref 30.0–36.0)
MCV: 97.1 fL (ref 78.0–100.0)
MONO ABS: 0.8 10*3/uL (ref 0.1–1.0)
MONOS PCT: 7 %
Neutro Abs: 8.1 10*3/uL — ABNORMAL HIGH (ref 1.7–7.7)
Neutrophils Relative %: 70 %
PLATELETS: 441 10*3/uL — AB (ref 150–400)
RBC: 2.74 MIL/uL — ABNORMAL LOW (ref 4.22–5.81)
RDW: 16.3 % — AB (ref 11.5–15.5)
WBC: 11.7 10*3/uL — ABNORMAL HIGH (ref 4.0–10.5)

## 2016-08-05 LAB — MAGNESIUM: Magnesium: 1.6 mg/dL — ABNORMAL LOW (ref 1.7–2.4)

## 2016-08-05 LAB — PHOSPHORUS: Phosphorus: 3 mg/dL (ref 2.5–4.6)

## 2016-08-05 LAB — VANCOMYCIN, TROUGH: VANCOMYCIN TR: 30 ug/mL — AB (ref 15–20)

## 2016-08-06 LAB — COMPREHENSIVE METABOLIC PANEL
ALT: 36 U/L (ref 17–63)
AST: 49 U/L — AB (ref 15–41)
Albumin: 1.9 g/dL — ABNORMAL LOW (ref 3.5–5.0)
Alkaline Phosphatase: 94 U/L (ref 38–126)
Anion gap: 5 (ref 5–15)
BUN: 21 mg/dL — AB (ref 6–20)
CALCIUM: 8.8 mg/dL — AB (ref 8.9–10.3)
CHLORIDE: 112 mmol/L — AB (ref 101–111)
CO2: 26 mmol/L (ref 22–32)
CREATININE: 0.83 mg/dL (ref 0.61–1.24)
GFR calc Af Amer: >60 mL/min (ref >60)
Glucose, Bld: 122 mg/dL — ABNORMAL HIGH (ref 65–99)
Potassium: 3.9 mmol/L (ref 3.5–5.1)
SODIUM: 143 mmol/L (ref 135–145)
Total Bilirubin: 0.5 mg/dL (ref 0.3–1.2)
Total Protein: 5.7 g/dL — ABNORMAL LOW (ref 6.5–8.1)

## 2016-08-06 LAB — PHOSPHORUS: PHOSPHORUS: 3.8 mg/dL (ref 2.5–4.6)

## 2016-08-06 LAB — TRIGLYCERIDES: Triglycerides: 183 mg/dL — ABNORMAL HIGH (ref <150)

## 2016-08-06 LAB — VANCOMYCIN, TROUGH: VANCOMYCIN TR: 14 ug/mL — AB (ref 15–20)

## 2016-08-06 LAB — MAGNESIUM: MAGNESIUM: 1.9 mg/dL (ref 1.7–2.4)

## 2016-08-07 LAB — RENAL FUNCTION PANEL
ANION GAP: 5 (ref 5–15)
Albumin: 1.8 g/dL — ABNORMAL LOW (ref 3.5–5.0)
BUN: 27 mg/dL — ABNORMAL HIGH (ref 6–20)
CHLORIDE: 113 mmol/L — AB (ref 101–111)
CO2: 24 mmol/L (ref 22–32)
CREATININE: 0.94 mg/dL (ref 0.61–1.24)
Calcium: 8.9 mg/dL (ref 8.9–10.3)
Glucose, Bld: 128 mg/dL — ABNORMAL HIGH (ref 65–99)
POTASSIUM: 3.6 mmol/L (ref 3.5–5.1)
Phosphorus: 3 mg/dL (ref 2.5–4.6)
Sodium: 142 mmol/L (ref 135–145)

## 2016-08-07 LAB — CBC WITH DIFFERENTIAL/PLATELET
BASOS ABS: 0.1 10*3/uL (ref 0.0–0.1)
Basophils Relative: 0 %
EOS PCT: 2 %
Eosinophils Absolute: 0.3 10*3/uL (ref 0.0–0.7)
HEMATOCRIT: 25.3 % — AB (ref 39.0–52.0)
HEMOGLOBIN: 7.8 g/dL — AB (ref 13.0–17.0)
LYMPHS ABS: 1.7 10*3/uL (ref 0.7–4.0)
LYMPHS PCT: 9 %
MCH: 29.7 pg (ref 26.0–34.0)
MCHC: 30.8 g/dL (ref 30.0–36.0)
MCV: 96.2 fL (ref 78.0–100.0)
Monocytes Absolute: 1 10*3/uL (ref 0.1–1.0)
Monocytes Relative: 5 %
NEUTROS ABS: 15.4 10*3/uL — AB (ref 1.7–7.7)
NEUTROS PCT: 83 %
Platelets: 476 10*3/uL — ABNORMAL HIGH (ref 150–400)
RBC: 2.63 MIL/uL — AB (ref 4.22–5.81)
RDW: 16.4 % — ABNORMAL HIGH (ref 11.5–15.5)
WBC: 18.5 10*3/uL — AB (ref 4.0–10.5)

## 2016-08-07 LAB — PHOSPHORUS: PHOSPHORUS: 3 mg/dL (ref 2.5–4.6)

## 2016-08-07 LAB — MAGNESIUM: Magnesium: 2 mg/dL (ref 1.7–2.4)

## 2016-08-08 ENCOUNTER — Other Ambulatory Visit (HOSPITAL_COMMUNITY): Payer: Self-pay

## 2016-08-08 LAB — CBC WITH DIFFERENTIAL/PLATELET
BASOS ABS: 0.1 10*3/uL (ref 0.0–0.1)
BASOS PCT: 1 %
EOS ABS: 0.5 10*3/uL (ref 0.0–0.7)
Eosinophils Relative: 3 %
HEMATOCRIT: 25 % — AB (ref 39.0–52.0)
Hemoglobin: 7.5 g/dL — ABNORMAL LOW (ref 13.0–17.0)
Lymphocytes Relative: 13 %
Lymphs Abs: 2 10*3/uL (ref 0.7–4.0)
MCH: 29.1 pg (ref 26.0–34.0)
MCHC: 30 g/dL (ref 30.0–36.0)
MCV: 96.9 fL (ref 78.0–100.0)
MONO ABS: 1 10*3/uL (ref 0.1–1.0)
Monocytes Relative: 7 %
NEUTROS ABS: 11.3 10*3/uL — AB (ref 1.7–7.7)
NEUTROS PCT: 76 %
Platelets: 497 10*3/uL — ABNORMAL HIGH (ref 150–400)
RBC: 2.58 MIL/uL — ABNORMAL LOW (ref 4.22–5.81)
RDW: 16.4 % — AB (ref 11.5–15.5)
WBC: 14.9 10*3/uL — ABNORMAL HIGH (ref 4.0–10.5)

## 2016-08-08 LAB — RENAL FUNCTION PANEL
ALBUMIN: 2 g/dL — AB (ref 3.5–5.0)
Anion gap: 6 (ref 5–15)
BUN: 26 mg/dL — AB (ref 6–20)
CALCIUM: 8.9 mg/dL (ref 8.9–10.3)
CO2: 25 mmol/L (ref 22–32)
CREATININE: 0.98 mg/dL (ref 0.61–1.24)
Chloride: 111 mmol/L (ref 101–111)
GFR calc Af Amer: >60 mL/min (ref >60)
GLUCOSE: 129 mg/dL — AB (ref 65–99)
Phosphorus: 3.6 mg/dL (ref 2.5–4.6)
Potassium: 3.6 mmol/L (ref 3.5–5.1)
SODIUM: 142 mmol/L (ref 135–145)

## 2016-08-08 LAB — MAGNESIUM: MAGNESIUM: 2.1 mg/dL (ref 1.7–2.4)

## 2016-08-08 LAB — PROCALCITONIN: Procalcitonin: 0.13 ng/mL

## 2016-08-10 LAB — CBC WITH DIFFERENTIAL/PLATELET
BASOS PCT: 1 %
Basophils Absolute: 0.1 10*3/uL (ref 0.0–0.1)
EOS PCT: 4 %
Eosinophils Absolute: 0.5 10*3/uL (ref 0.0–0.7)
HEMATOCRIT: 25 % — AB (ref 39.0–52.0)
Hemoglobin: 7.6 g/dL — ABNORMAL LOW (ref 13.0–17.0)
LYMPHS ABS: 2.4 10*3/uL (ref 0.7–4.0)
Lymphocytes Relative: 18 %
MCH: 29.2 pg (ref 26.0–34.0)
MCHC: 30.4 g/dL (ref 30.0–36.0)
MCV: 96.2 fL (ref 78.0–100.0)
MONOS PCT: 8 %
Monocytes Absolute: 1.1 10*3/uL — ABNORMAL HIGH (ref 0.1–1.0)
Neutro Abs: 9.1 10*3/uL — ABNORMAL HIGH (ref 1.7–7.7)
Neutrophils Relative %: 69 %
Platelets: 526 10*3/uL — ABNORMAL HIGH (ref 150–400)
RBC: 2.6 MIL/uL — AB (ref 4.22–5.81)
RDW: 16 % — AB (ref 11.5–15.5)
WBC: 13.2 10*3/uL — AB (ref 4.0–10.5)

## 2016-08-10 LAB — RENAL FUNCTION PANEL
Albumin: 2 g/dL — ABNORMAL LOW (ref 3.5–5.0)
Anion gap: 9 (ref 5–15)
BUN: 25 mg/dL — ABNORMAL HIGH (ref 6–20)
CHLORIDE: 108 mmol/L (ref 101–111)
CO2: 25 mmol/L (ref 22–32)
Calcium: 9.2 mg/dL (ref 8.9–10.3)
Creatinine, Ser: 0.99 mg/dL (ref 0.61–1.24)
Glucose, Bld: 96 mg/dL (ref 65–99)
POTASSIUM: 4 mmol/L (ref 3.5–5.1)
Phosphorus: 3.9 mg/dL (ref 2.5–4.6)
Sodium: 142 mmol/L (ref 135–145)

## 2016-08-10 LAB — BASIC METABOLIC PANEL
Anion gap: 9 (ref 5–15)
BUN: 24 mg/dL — AB (ref 6–20)
CHLORIDE: 108 mmol/L (ref 101–111)
CO2: 24 mmol/L (ref 22–32)
Calcium: 9.1 mg/dL (ref 8.9–10.3)
Creatinine, Ser: 0.97 mg/dL (ref 0.61–1.24)
GFR calc Af Amer: >60 mL/min (ref >60)
GFR calc non Af Amer: >60 mL/min (ref >60)
Glucose, Bld: 93 mg/dL (ref 65–99)
POTASSIUM: 3.9 mmol/L (ref 3.5–5.1)
SODIUM: 141 mmol/L (ref 135–145)

## 2016-08-10 LAB — MAGNESIUM: MAGNESIUM: 1.9 mg/dL (ref 1.7–2.4)

## 2017-12-24 IMAGING — CR DG ABD PORTABLE 1V
1 series · 1 of 1 positions shown · non-contrast
Comparison: None.

CLINICAL DATA: Patient status post NG tube insertion.

EXAM:
PORTABLE ABDOMEN - 1 VIEW

[AP]
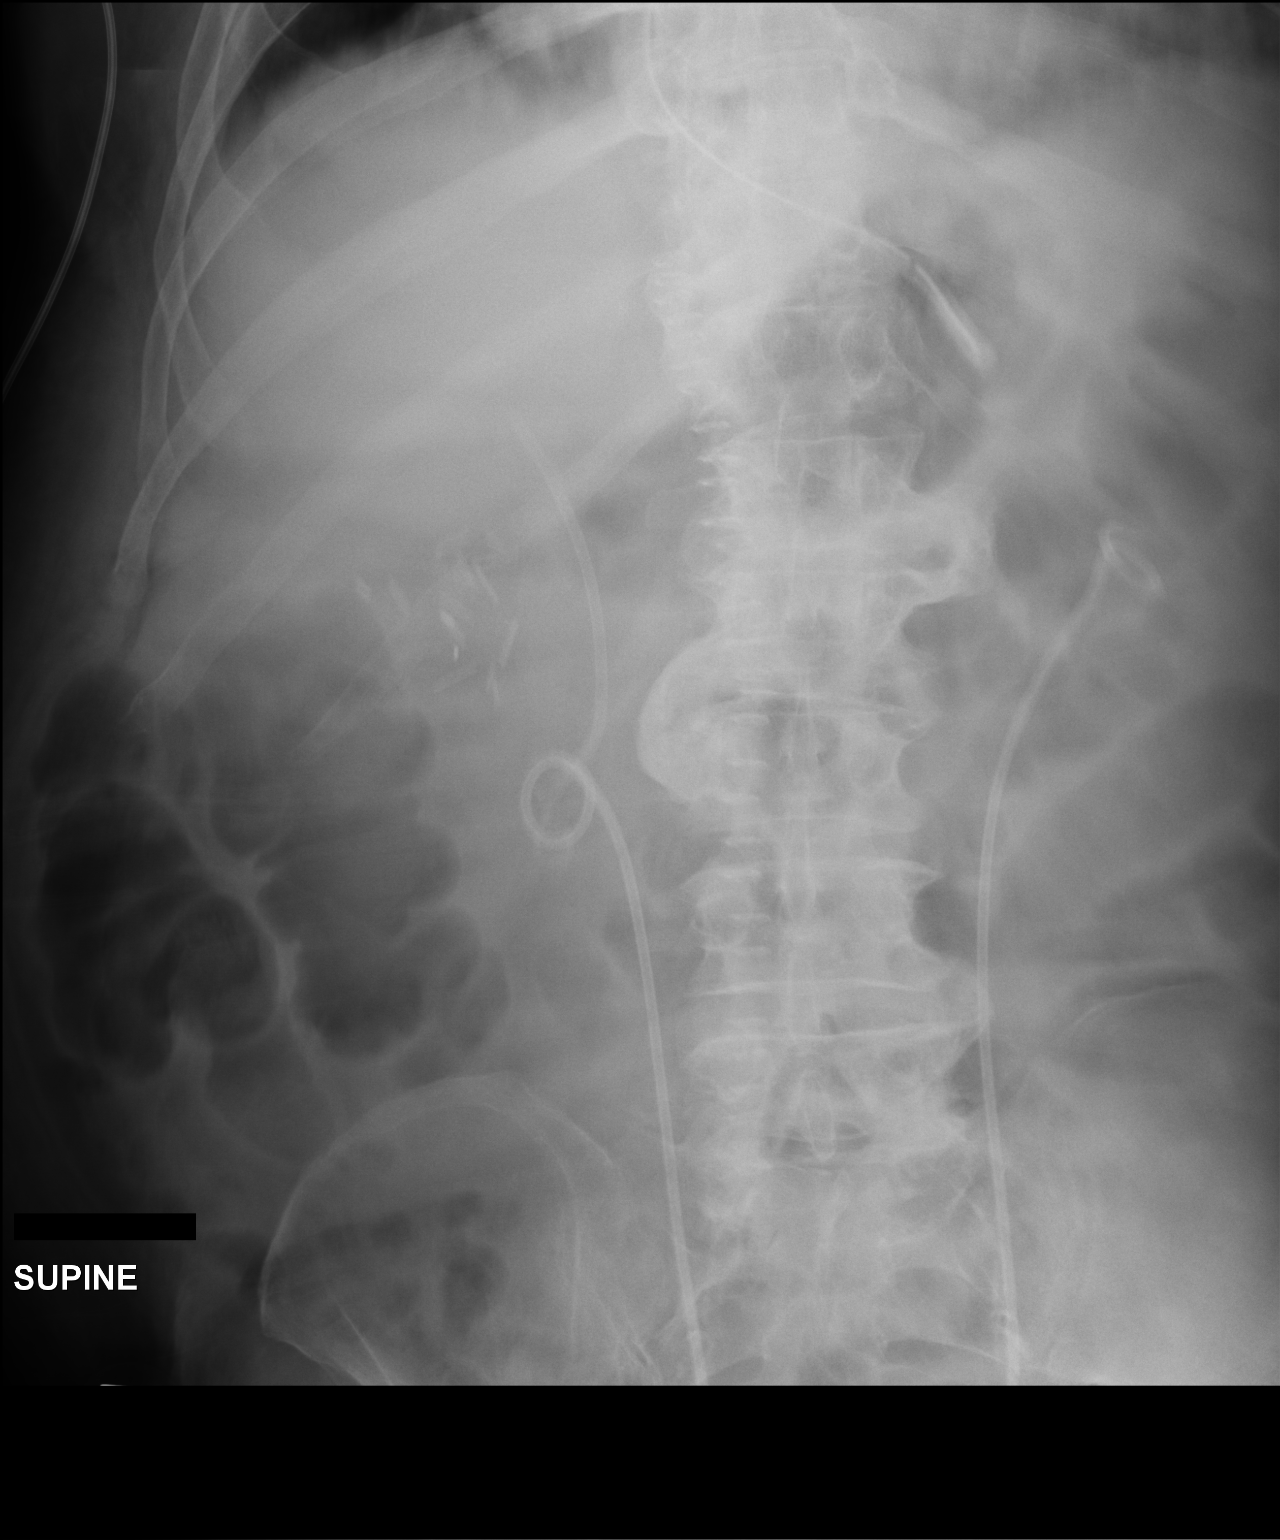

[1 of 1 positions shown; findings below may reference images not displayed]

FINDINGS: Enteric tube tip and side-port project over the stomach. The
proximal aspect of bilateral nephroureteral stents are demonstrated.
Right upper quadrant surgical clips. Probable biliary stent. Midline
surgical staple line. Lumbar spine degenerative changes. Gas
demonstrated within prominent loops of small bowel and colon.
IMPRESSION: Enteric tube tip and side-port project over the stomach.

## 2017-12-27 IMAGING — CR DG CHEST 1V PORT
1 series · 1 of 1 positions shown · non-contrast
Comparison: None.

CLINICAL DATA: Recent aspiration and tachycardia, initial encounter

EXAM:
PORTABLE CHEST 1 VIEW

[AP]
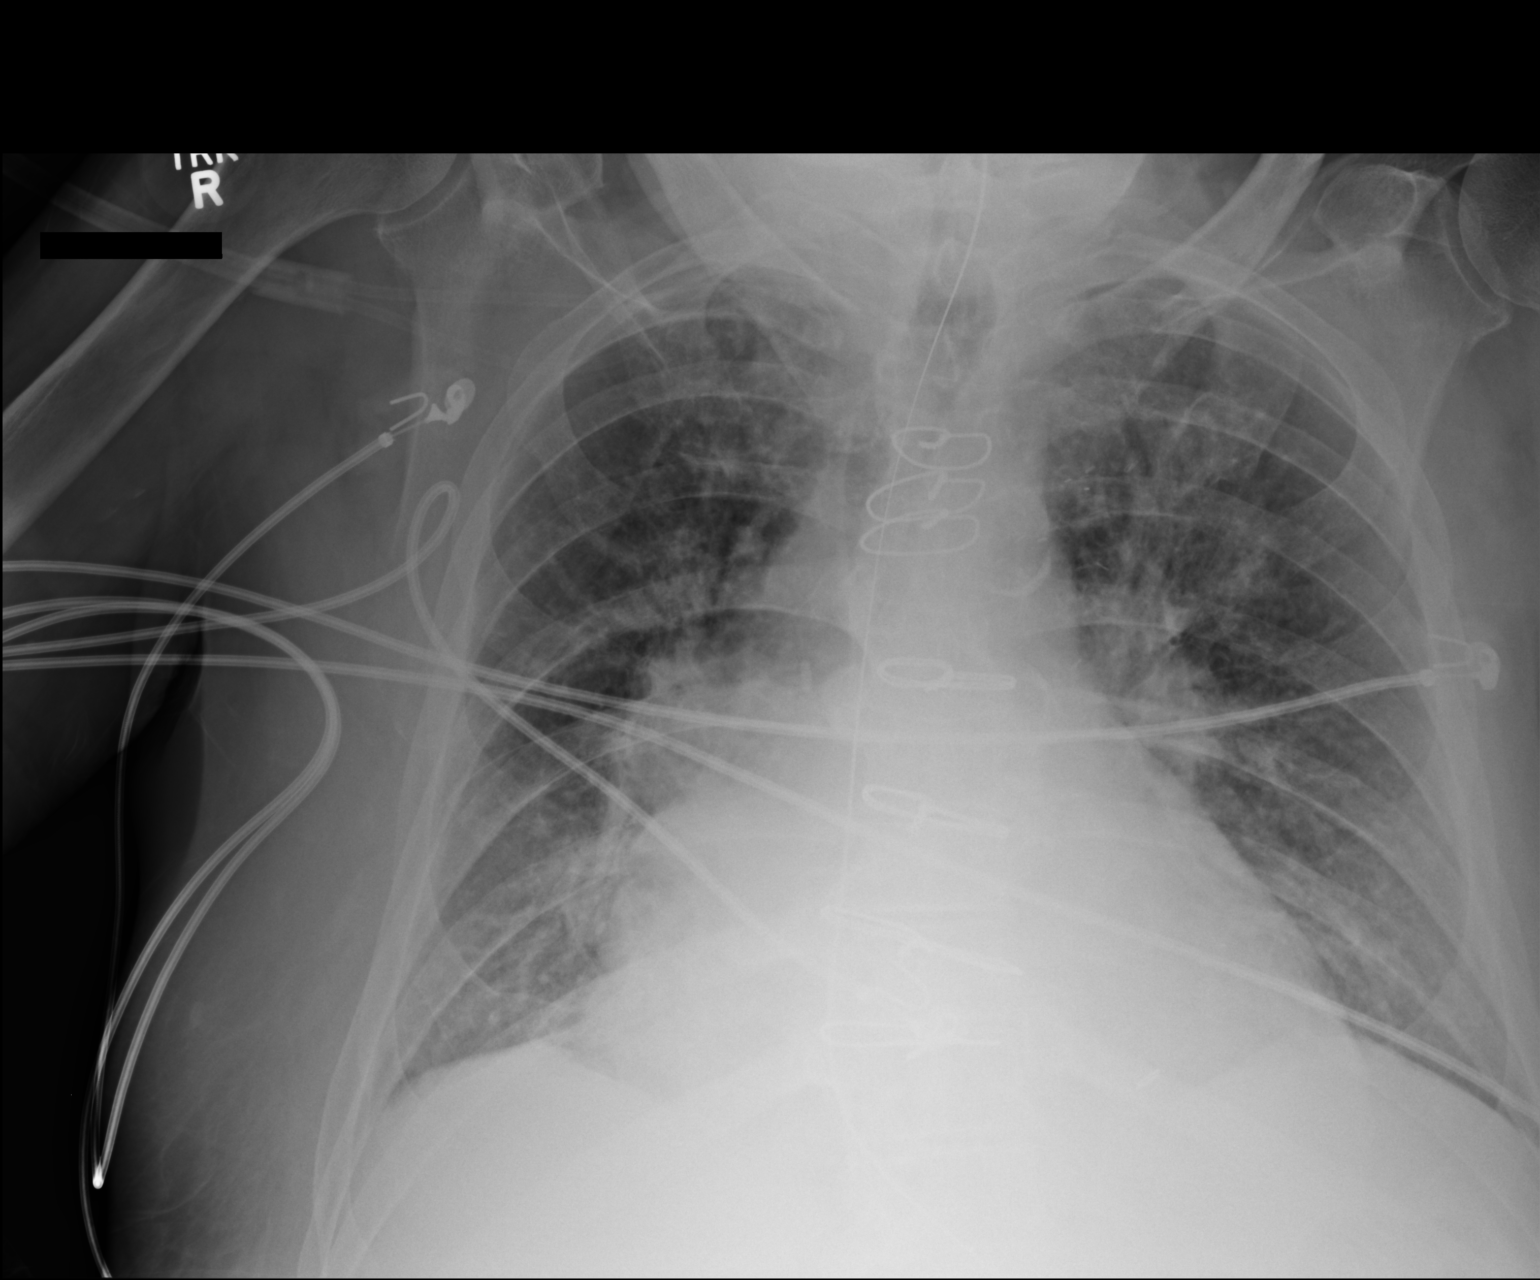

[1 of 1 positions shown; findings below may reference images not displayed]

FINDINGS: Cardiac shadow is enlarged. A nasogastric catheter extends into the
stomach. Mild vascular congestion is noted without interstitial
edema. No focal infiltrate is seen.
IMPRESSION: Mild vascular congestion.

## 2017-12-29 IMAGING — CT CT ABDOMEN W/O CM
2 of 5 series · 15 of 46 positions shown, 17 images · non-contrast
Comparison: None.

CLINICAL DATA: 65-year-old male with a history of dysphagia.

EXAM:
CT ABDOMEN WITHOUT CONTRAST
TECHNIQUE: Multidetector CT imaging of the abdomen was performed following the
standard protocol without IV contrast.

[Series 2: a/p w/o 5mm · axial · non-contrast · 0.92mm/px · z∈[+960,+1234]mm · 12 of 63 slices shown, 14 images]
[im 4/63  soft-tissue]
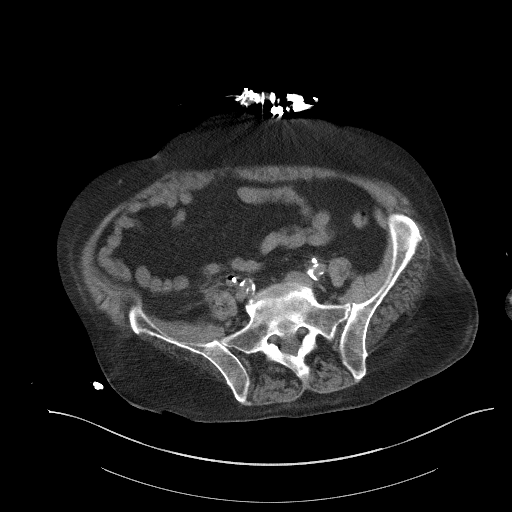
[im 4/63  bone]
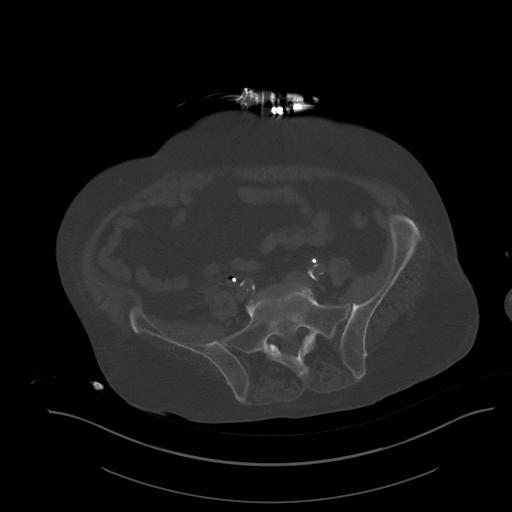
[im 8/63  soft-tissue]
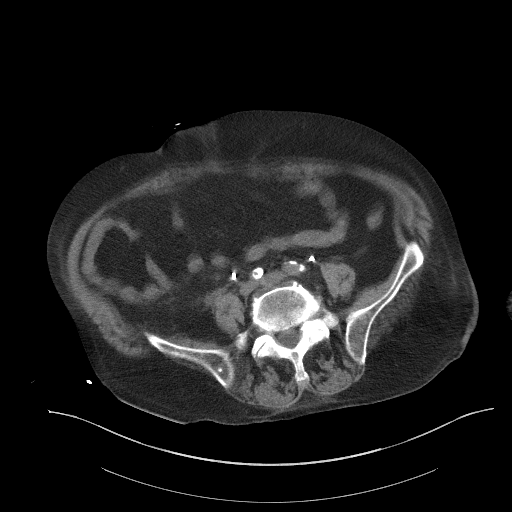
[im 16/63  soft-tissue]
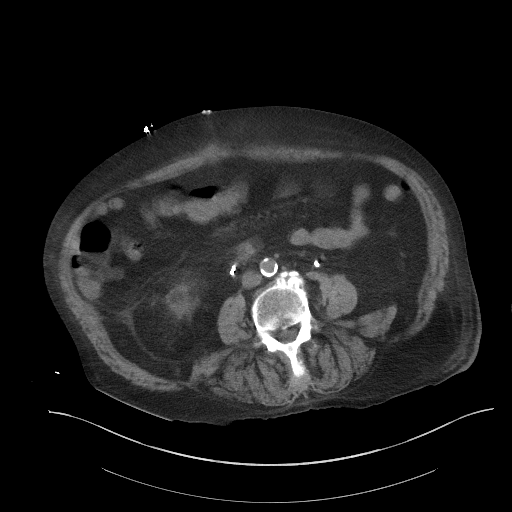
[im 20/63  soft-tissue]
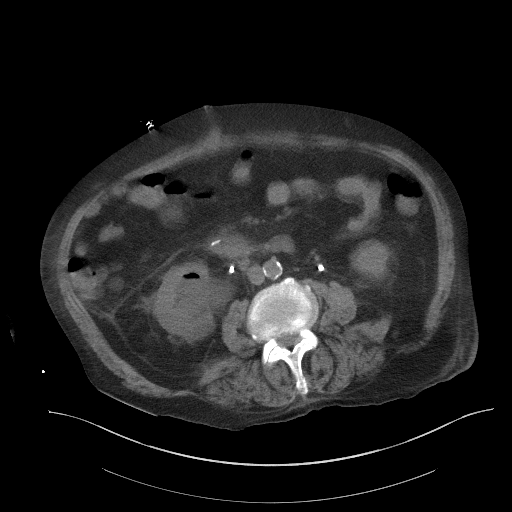
[im 24/63  soft-tissue]
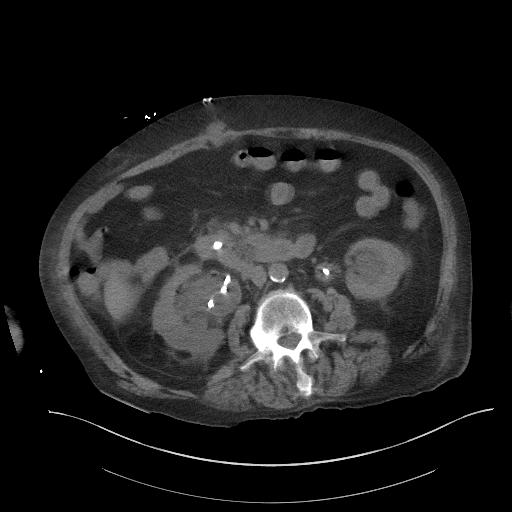
[im 28/63  soft-tissue]
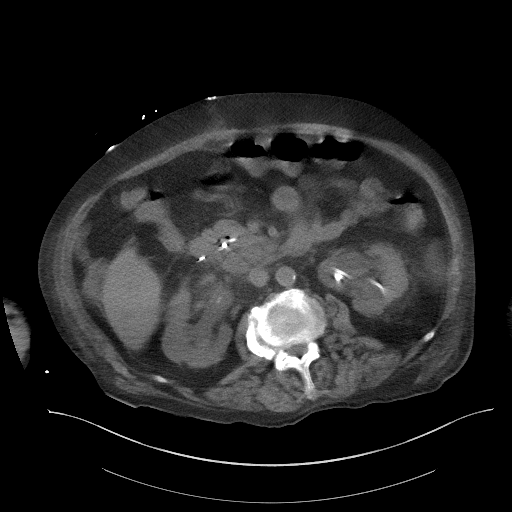
[im 35/63  soft-tissue]
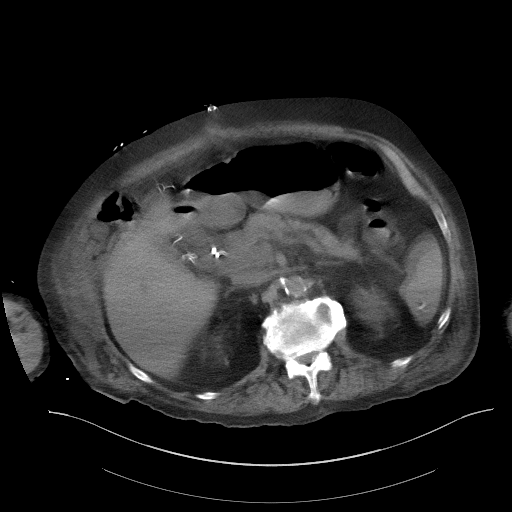
[im 39/63  soft-tissue]
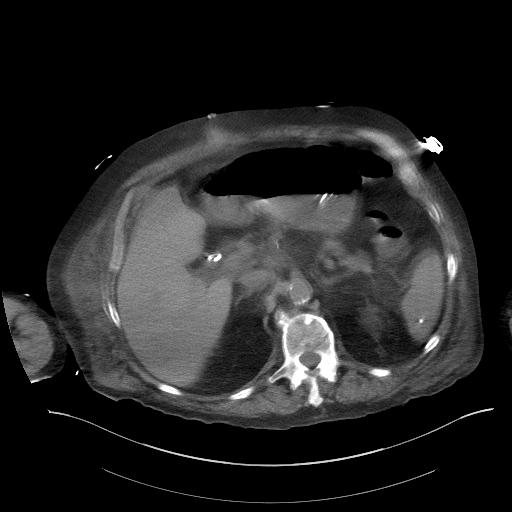
[im 43/63  soft-tissue]
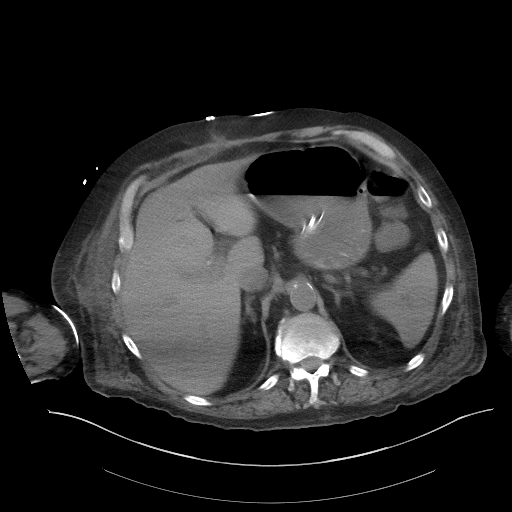
[im 43/63  bone]
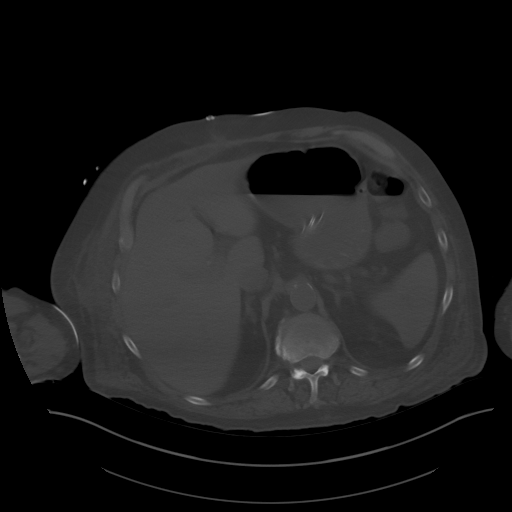
[im 47/63  soft-tissue]
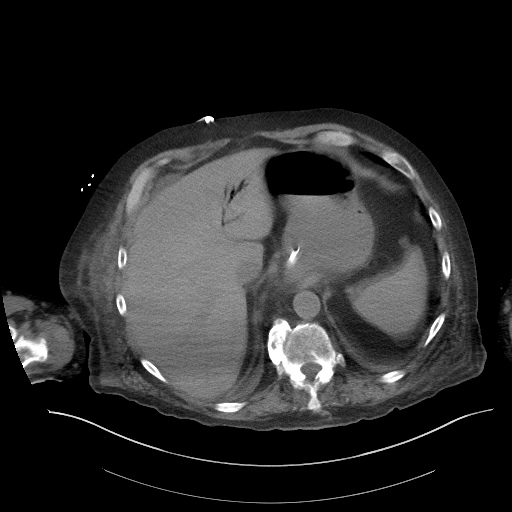
[im 55/63  soft-tissue]
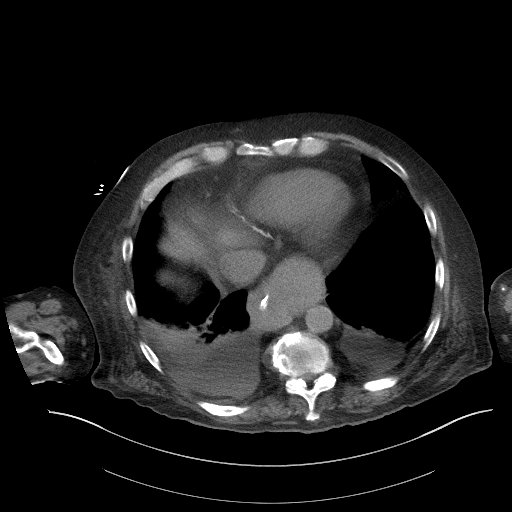
[im 59/63  soft-tissue]
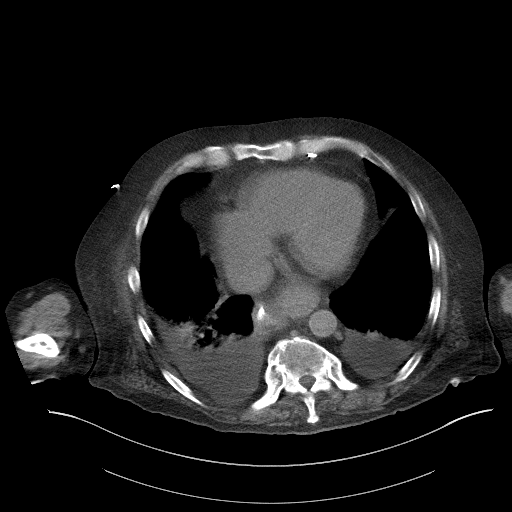

[Series 5: a/p w/o cor · coronal · non-contrast · 0.61mm/px · 3 of 151 slices shown]
[im 51/151  soft-tissue]
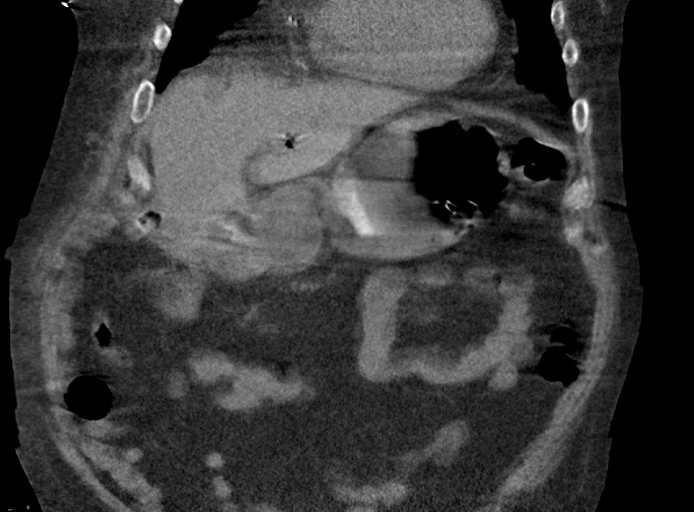
[im 67/151  soft-tissue]
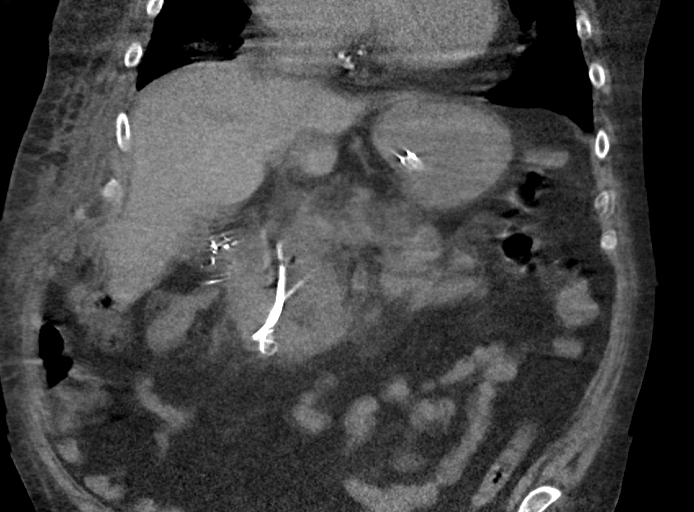
[im 84/151  soft-tissue]
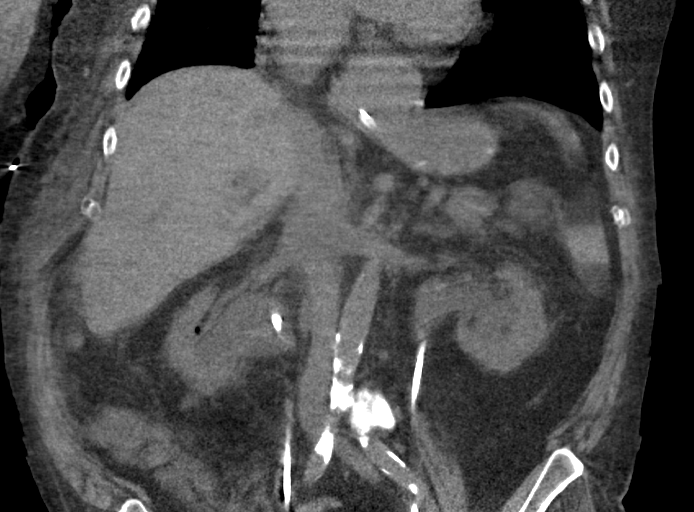

[15 of 46 positions shown; findings below may reference images not displayed]

FINDINGS: Lower chest:

Unremarkable appearance of the soft tissues of the chest wall.

Heart size within normal limits.  No pericardial fluid/thickening.

Calcifications in the distribution of the right coronary artery.

No lower mediastinal adenopathy.

Large hiatal hernia

Surgical changes of median sternotomy.

Bilateral pleural effusions with associated atelectasis. Respiratory
motion somewhat limits evaluation of the lung bases.

Abdomen:

Surgical changes in the midline abdomen with surgical staples within
the soft tissues. Trace fluid along the surgical site of the
anterior abdominal wall musculature measuring 19 mm, potentially
postoperative for small seroma/hematoma.

Unremarkable appearance of liver parenchyma.

Small amount of gas within the biliary tree of the left liver lobe.
There is a stent within the common bile duct.

Surgical changes of cholecystectomy.

Calcifications of the spleen, compatible with prior granulomatous
disease.

Unremarkable bilateral adrenal glands.

Unremarkable appearance of pancreas. No peripancreatic inflammatory
changes.

Gas overlying the right liver lobe is presumably within bowel though
evaluation limited by motion.

Gastric tube terminates within the lumen of the stomach.
Unremarkable appearance of visualized small bowel and colon.

Bilateral moderate hydronephrosis with ureteral stents in place
within the left and right collecting system. Gas within the right
collecting system, potentially related to recent instrumentation.

Calcifications of the abdominal aorta and bilateral iliac system.

There is ill-defined soft tissue posterior to the pancreatic head,
adjacent to the duodenal loop (image 31). Ill-defined fat planes in
this region.
IMPRESSION: No acute finding on the abdominal CT.

Ill-defined soft tissue posterior to the pancreatic head, with
ill-defined fat planes. It is uncertain if this represents artifact
secondary to the significant respiratory motion, or if this is
abnormal soft tissue, which would be of indeterminate etiology. This
CT does not rule out pathologic adenopathy, pancreatitis, or
potentially a soft tissue lesion. Further evaluation with
contrast-enhanced CT may be considered, and may require sedation to
resolve patient motion issue.

Bilateral ureteral stents in the collecting system of the kidneys.
Mild a moderate bilateral hydronephrosis. Gas within the right
collecting system, potentially related to instrumentation. Recommend
correlation with a history of recent surgery.

Common bile duct stent in place, which likely accounts for the gas
within the biliary tree. If there is concern for infection,
recommend correlation with lab values.

Cholecystectomy.

Large hiatal hernia.

Aortic atherosclerosis.  Associated coronary artery disease.

Bilateral pleural effusions.

## 2018-03-31 DIAGNOSIS — I2699 Other pulmonary embolism without acute cor pulmonale: Secondary | ICD-10-CM

## 2018-03-31 DIAGNOSIS — I482 Chronic atrial fibrillation: Secondary | ICD-10-CM | POA: Diagnosis not present

## 2018-03-31 DIAGNOSIS — J9621 Acute and chronic respiratory failure with hypoxia: Secondary | ICD-10-CM | POA: Diagnosis not present

## 2018-03-31 DIAGNOSIS — A419 Sepsis, unspecified organism: Secondary | ICD-10-CM | POA: Diagnosis not present

## 2018-04-01 DIAGNOSIS — I482 Chronic atrial fibrillation: Secondary | ICD-10-CM

## 2018-04-01 DIAGNOSIS — A419 Sepsis, unspecified organism: Secondary | ICD-10-CM | POA: Diagnosis not present

## 2018-04-01 DIAGNOSIS — J9621 Acute and chronic respiratory failure with hypoxia: Secondary | ICD-10-CM | POA: Diagnosis not present

## 2018-04-01 DIAGNOSIS — I2699 Other pulmonary embolism without acute cor pulmonale: Secondary | ICD-10-CM

## 2018-04-02 DIAGNOSIS — J9621 Acute and chronic respiratory failure with hypoxia: Secondary | ICD-10-CM

## 2018-04-02 DIAGNOSIS — I482 Chronic atrial fibrillation: Secondary | ICD-10-CM | POA: Diagnosis not present

## 2018-04-02 DIAGNOSIS — I2699 Other pulmonary embolism without acute cor pulmonale: Secondary | ICD-10-CM | POA: Diagnosis not present

## 2018-04-02 DIAGNOSIS — A419 Sepsis, unspecified organism: Secondary | ICD-10-CM | POA: Diagnosis not present

## 2018-04-03 DIAGNOSIS — I482 Chronic atrial fibrillation: Secondary | ICD-10-CM

## 2018-04-03 DIAGNOSIS — J9621 Acute and chronic respiratory failure with hypoxia: Secondary | ICD-10-CM | POA: Diagnosis not present

## 2018-04-03 DIAGNOSIS — A419 Sepsis, unspecified organism: Secondary | ICD-10-CM | POA: Diagnosis not present

## 2018-04-03 DIAGNOSIS — I2699 Other pulmonary embolism without acute cor pulmonale: Secondary | ICD-10-CM | POA: Diagnosis not present

## 2018-04-04 DIAGNOSIS — A419 Sepsis, unspecified organism: Secondary | ICD-10-CM

## 2018-04-04 DIAGNOSIS — I482 Chronic atrial fibrillation: Secondary | ICD-10-CM | POA: Diagnosis not present

## 2018-04-04 DIAGNOSIS — I2699 Other pulmonary embolism without acute cor pulmonale: Secondary | ICD-10-CM

## 2018-04-04 DIAGNOSIS — J9621 Acute and chronic respiratory failure with hypoxia: Secondary | ICD-10-CM | POA: Diagnosis not present

## 2018-04-05 DIAGNOSIS — J9621 Acute and chronic respiratory failure with hypoxia: Secondary | ICD-10-CM

## 2018-04-05 DIAGNOSIS — I482 Chronic atrial fibrillation: Secondary | ICD-10-CM

## 2018-04-05 DIAGNOSIS — A419 Sepsis, unspecified organism: Secondary | ICD-10-CM | POA: Diagnosis not present

## 2018-04-05 DIAGNOSIS — I2699 Other pulmonary embolism without acute cor pulmonale: Secondary | ICD-10-CM

## 2018-04-06 DIAGNOSIS — A419 Sepsis, unspecified organism: Secondary | ICD-10-CM

## 2018-04-06 DIAGNOSIS — I482 Chronic atrial fibrillation: Secondary | ICD-10-CM

## 2018-04-06 DIAGNOSIS — I2699 Other pulmonary embolism without acute cor pulmonale: Secondary | ICD-10-CM

## 2018-04-06 DIAGNOSIS — J9621 Acute and chronic respiratory failure with hypoxia: Secondary | ICD-10-CM

## 2018-04-07 DIAGNOSIS — A419 Sepsis, unspecified organism: Secondary | ICD-10-CM | POA: Diagnosis not present

## 2018-04-07 DIAGNOSIS — J9621 Acute and chronic respiratory failure with hypoxia: Secondary | ICD-10-CM

## 2018-04-07 DIAGNOSIS — I2699 Other pulmonary embolism without acute cor pulmonale: Secondary | ICD-10-CM

## 2018-04-07 DIAGNOSIS — I482 Chronic atrial fibrillation: Secondary | ICD-10-CM

## 2018-04-08 DIAGNOSIS — J9621 Acute and chronic respiratory failure with hypoxia: Secondary | ICD-10-CM

## 2018-04-08 DIAGNOSIS — A419 Sepsis, unspecified organism: Secondary | ICD-10-CM | POA: Diagnosis not present

## 2018-04-08 DIAGNOSIS — I482 Chronic atrial fibrillation: Secondary | ICD-10-CM

## 2018-04-08 DIAGNOSIS — I2699 Other pulmonary embolism without acute cor pulmonale: Secondary | ICD-10-CM

## 2018-04-09 DIAGNOSIS — A419 Sepsis, unspecified organism: Secondary | ICD-10-CM | POA: Diagnosis not present

## 2018-04-09 DIAGNOSIS — I2699 Other pulmonary embolism without acute cor pulmonale: Secondary | ICD-10-CM

## 2018-04-09 DIAGNOSIS — I482 Chronic atrial fibrillation: Secondary | ICD-10-CM

## 2018-04-09 DIAGNOSIS — J9621 Acute and chronic respiratory failure with hypoxia: Secondary | ICD-10-CM | POA: Diagnosis not present

## 2018-04-10 DIAGNOSIS — I2699 Other pulmonary embolism without acute cor pulmonale: Secondary | ICD-10-CM | POA: Diagnosis not present

## 2018-04-10 DIAGNOSIS — J9621 Acute and chronic respiratory failure with hypoxia: Secondary | ICD-10-CM | POA: Diagnosis not present

## 2018-04-10 DIAGNOSIS — A419 Sepsis, unspecified organism: Secondary | ICD-10-CM | POA: Diagnosis not present

## 2018-04-10 DIAGNOSIS — I482 Chronic atrial fibrillation: Secondary | ICD-10-CM | POA: Diagnosis not present

## 2018-04-11 DIAGNOSIS — I2699 Other pulmonary embolism without acute cor pulmonale: Secondary | ICD-10-CM

## 2018-04-11 DIAGNOSIS — A419 Sepsis, unspecified organism: Secondary | ICD-10-CM

## 2018-04-11 DIAGNOSIS — I482 Chronic atrial fibrillation: Secondary | ICD-10-CM

## 2018-04-11 DIAGNOSIS — J9621 Acute and chronic respiratory failure with hypoxia: Secondary | ICD-10-CM

## 2018-04-13 DIAGNOSIS — I482 Chronic atrial fibrillation: Secondary | ICD-10-CM | POA: Diagnosis not present

## 2018-04-13 DIAGNOSIS — I2699 Other pulmonary embolism without acute cor pulmonale: Secondary | ICD-10-CM | POA: Diagnosis not present

## 2018-04-13 DIAGNOSIS — J9621 Acute and chronic respiratory failure with hypoxia: Secondary | ICD-10-CM

## 2018-04-13 DIAGNOSIS — A419 Sepsis, unspecified organism: Secondary | ICD-10-CM

## 2018-04-14 DIAGNOSIS — J9621 Acute and chronic respiratory failure with hypoxia: Secondary | ICD-10-CM

## 2018-04-14 DIAGNOSIS — A419 Sepsis, unspecified organism: Secondary | ICD-10-CM | POA: Diagnosis not present

## 2018-04-14 DIAGNOSIS — I482 Chronic atrial fibrillation: Secondary | ICD-10-CM | POA: Diagnosis not present

## 2018-04-14 DIAGNOSIS — I2699 Other pulmonary embolism without acute cor pulmonale: Secondary | ICD-10-CM | POA: Diagnosis not present

## 2018-04-15 DIAGNOSIS — J9621 Acute and chronic respiratory failure with hypoxia: Secondary | ICD-10-CM

## 2018-04-15 DIAGNOSIS — A419 Sepsis, unspecified organism: Secondary | ICD-10-CM

## 2018-04-15 DIAGNOSIS — I482 Chronic atrial fibrillation: Secondary | ICD-10-CM | POA: Diagnosis not present

## 2018-04-15 DIAGNOSIS — I2699 Other pulmonary embolism without acute cor pulmonale: Secondary | ICD-10-CM | POA: Diagnosis not present

## 2018-09-16 DEATH — deceased
